# Patient Record
Sex: Female | Born: 1995
Health system: Southern US, Community
[De-identification: ages and names within clinical notes are randomized; demographics above are authoritative.]

## PROBLEM LIST (undated history)

## (undated) ENCOUNTER — Inpatient Hospital Stay (HOSPITAL_COMMUNITY): Payer: Self-pay

## (undated) DIAGNOSIS — E282 Polycystic ovarian syndrome: Secondary | ICD-10-CM

## (undated) DIAGNOSIS — K219 Gastro-esophageal reflux disease without esophagitis: Secondary | ICD-10-CM

## (undated) DIAGNOSIS — F419 Anxiety disorder, unspecified: Secondary | ICD-10-CM

## (undated) DIAGNOSIS — R87629 Unspecified abnormal cytological findings in specimens from vagina: Secondary | ICD-10-CM

## (undated) DIAGNOSIS — G90A Postural orthostatic tachycardia syndrome (POTS): Secondary | ICD-10-CM

## (undated) DIAGNOSIS — J189 Pneumonia, unspecified organism: Secondary | ICD-10-CM

## (undated) DIAGNOSIS — R519 Headache, unspecified: Secondary | ICD-10-CM

## (undated) DIAGNOSIS — G43909 Migraine, unspecified, not intractable, without status migrainosus: Secondary | ICD-10-CM

## (undated) DIAGNOSIS — R000 Tachycardia, unspecified: Secondary | ICD-10-CM

## (undated) HISTORY — DX: Tachycardia, unspecified: R00.0

## (undated) HISTORY — PX: LASIK: SHX215

## (undated) HISTORY — DX: Unspecified abnormal cytological findings in specimens from vagina: R87.629

## (undated) HISTORY — PX: ADENOIDECTOMY: SUR15

## (undated) HISTORY — DX: Headache, unspecified: R51.9

## (undated) HISTORY — DX: Migraine, unspecified, not intractable, without status migrainosus: G43.909

## (undated) HISTORY — PX: TYMPANOSTOMY TUBE PLACEMENT: SHX32

## (undated) HISTORY — PX: IR ANGIOGRAM FOLLOW UP STUDY: IMG697

---
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
## 2014-07-05 ENCOUNTER — Encounter: Payer: Self-pay | Admitting: Advanced Practice Midwife

## 2014-10-07 ENCOUNTER — Emergency Department (HOSPITAL_COMMUNITY)
Admission: EM | Admit: 2014-10-07 | Discharge: 2014-10-07 | Disposition: A | Discharge status: Home / Self Care | Payer: 59 | Attending: Emergency Medicine | Admitting: Emergency Medicine

## 2014-10-07 ENCOUNTER — Emergency Department (HOSPITAL_COMMUNITY)
Admission: EM | Admit: 2014-10-07 | Discharge: 2014-10-07 | Disposition: A | Discharge status: Home / Self Care | Payer: 59 | Source: Home / Self Care | Attending: Family Medicine | Admitting: Family Medicine

## 2014-10-07 ENCOUNTER — Encounter (HOSPITAL_COMMUNITY): Payer: Self-pay | Admitting: *Deleted

## 2014-10-07 ENCOUNTER — Encounter (HOSPITAL_COMMUNITY): Payer: Self-pay | Admitting: Emergency Medicine

## 2014-10-07 DIAGNOSIS — R000 Tachycardia, unspecified: Secondary | ICD-10-CM | POA: Insufficient documentation

## 2014-10-07 DIAGNOSIS — Z3202 Encounter for pregnancy test, result negative: Secondary | ICD-10-CM | POA: Insufficient documentation

## 2014-10-07 DIAGNOSIS — J45909 Unspecified asthma, uncomplicated: Secondary | ICD-10-CM | POA: Diagnosis not present

## 2014-10-07 DIAGNOSIS — J029 Acute pharyngitis, unspecified: Secondary | ICD-10-CM | POA: Diagnosis present

## 2014-10-07 DIAGNOSIS — R109 Unspecified abdominal pain: Secondary | ICD-10-CM | POA: Insufficient documentation

## 2014-10-07 DIAGNOSIS — R509 Fever, unspecified: Secondary | ICD-10-CM

## 2014-10-07 DIAGNOSIS — R1010 Upper abdominal pain, unspecified: Secondary | ICD-10-CM

## 2014-10-07 LAB — URINALYSIS, ROUTINE W REFLEX MICROSCOPIC
GLUCOSE, UA: NEGATIVE mg/dL
Ketones, ur: 40 mg/dL — AB
NITRITE: NEGATIVE
PH: 5.5 (ref 5.0–8.0)
Protein, ur: NEGATIVE mg/dL
SPECIFIC GRAVITY, URINE: 1.019 (ref 1.005–1.030)
Urobilinogen, UA: 1 mg/dL (ref 0.0–1.0)

## 2014-10-07 LAB — CBC WITH DIFFERENTIAL/PLATELET
BASOS ABS: 0 10*3/uL (ref 0.0–0.1)
Basophils Relative: 0 % (ref 0–1)
EOS PCT: 0 % (ref 0–5)
Eosinophils Absolute: 0 10*3/uL (ref 0.0–0.7)
HCT: 40.2 % (ref 36.0–46.0)
Hemoglobin: 13.5 g/dL (ref 12.0–15.0)
LYMPHS ABS: 0.9 10*3/uL (ref 0.7–4.0)
LYMPHS PCT: 12 % (ref 12–46)
MCH: 29.9 pg (ref 26.0–34.0)
MCHC: 33.6 g/dL (ref 30.0–36.0)
MCV: 89.1 fL (ref 78.0–100.0)
Monocytes Absolute: 1 10*3/uL (ref 0.1–1.0)
Monocytes Relative: 13 % — ABNORMAL HIGH (ref 3–12)
NEUTROS ABS: 5.7 10*3/uL (ref 1.7–7.7)
NEUTROS PCT: 75 % (ref 43–77)
PLATELETS: 193 10*3/uL (ref 150–400)
RBC: 4.51 MIL/uL (ref 3.87–5.11)
RDW: 11.9 % (ref 11.5–15.5)
WBC: 7.5 10*3/uL (ref 4.0–10.5)

## 2014-10-07 LAB — COMPREHENSIVE METABOLIC PANEL
ALK PHOS: 83 U/L (ref 39–117)
ALT: 16 U/L (ref 0–35)
AST: 18 U/L (ref 0–37)
Albumin: 3.7 g/dL (ref 3.5–5.2)
Anion gap: 15 (ref 5–15)
BUN: 9 mg/dL (ref 6–23)
CALCIUM: 9 mg/dL (ref 8.4–10.5)
CO2: 23 meq/L (ref 19–32)
Chloride: 99 mEq/L (ref 96–112)
Creatinine, Ser: 0.89 mg/dL (ref 0.50–1.10)
GLUCOSE: 100 mg/dL — AB (ref 70–99)
POTASSIUM: 3.6 meq/L — AB (ref 3.7–5.3)
SODIUM: 137 meq/L (ref 137–147)
Total Bilirubin: 0.4 mg/dL (ref 0.3–1.2)
Total Protein: 7.3 g/dL (ref 6.0–8.3)

## 2014-10-07 LAB — PREGNANCY, URINE: PREG TEST UR: NEGATIVE

## 2014-10-07 LAB — URINE MICROSCOPIC-ADD ON

## 2014-10-07 LAB — POCT RAPID STREP A: STREPTOCOCCUS, GROUP A SCREEN (DIRECT): NEGATIVE

## 2014-10-07 LAB — LIPASE, BLOOD: Lipase: 26 U/L (ref 11–59)

## 2014-10-07 MED ORDER — ACETAMINOPHEN 325 MG PO TABS
ORAL_TABLET | ORAL | Status: AC
  Filled 2014-10-07: qty 3

## 2014-10-07 NOTE — Discharge Instructions (Signed)
Abdominal (belly) pain can be caused by many things. Your caregiver performed an examination and possibly ordered blood/urine tests and imaging (CT scan, x-rays, ultrasound). Many cases can be observed and treated at home after initial evaluation in the emergency department. Even though you are being discharged home, abdominal pain can be unpredictable. Therefore, you need a repeated exam if your pain does not resolve, returns, or worsens. Most patients with abdominal pain don't have to be admitted to the hospital or have surgery, but serious problems like appendicitis and gallbladder attacks can start out as nonspecific pain. Many abdominal conditions cannot be diagnosed in one visit, so follow-up evaluations are very important. SEEK IMMEDIATE MEDICAL ATTENTION IF: The pain does not go away or becomes severe.  A temperature above 101 develops.  Repeated vomiting occurs (multiple episodes).  The pain becomes localized to portions of the abdomen. The right side could possibly be appendicitis. In an adult, the left lower portion of the abdomen could be colitis or diverticulitis.  Blood is being passed in stools or vomit (bright red or black tarry stools).  Return also if you develop chest pain, difficulty breathing, dizziness or fainting, or become confused, poorly responsive, or inconsolable (young children).  A fever means your temperature is over 100.4 F (37.8C). In adults this is most often due to viral or bacterial infections. If a fever lasts for over 2 or 3 days and does not get better with medicine, further medical examination is needed to find the cause. SEEK MEDICAL CARE IF: Your temperature stays around 104 F (40 C) or if you have repeated vomiting, unable to take fluids, breathing problems, a severe headache, confusion, a new rash, abdominal pain, difficulty urinating, become poorly responsive, or any new symptoms.

## 2014-10-07 NOTE — ED Notes (Signed)
C/o  Severe sore throat and fever since Friday.  No otc meds taken for symptoms.   Denies vomiting and diarrhea.

## 2014-10-07 NOTE — ED Notes (Signed)
The pt has had a sorethroat abd pain elevated temp since Friday.  She was seen at [facility] and a neg strep screen was done.  She had tylenol there  And she was sent down for treatment  lmp last week

## 2014-10-07 NOTE — ED Notes (Signed)
Pt given 975 mg of tylenol for fever.  Mw,cma 

## 2014-10-07 NOTE — ED Provider Notes (Signed)
Kari Reilly is a 18 y.o. female who presents to Urgent Care today for sore throat fevers and body aches. Symptoms present for 3 days. No vomiting or diarrhea. No abdominal pain. Patient notes mild chest pain. She feels well otherwise.   Past Medical History  Diagnosis Date  . Asthma    Past Surgical History  Procedure Laterality Date  . Tympanostomy tube placement    . Adenoidectomy     History  Substance Use Topics  . Smoking status: Never Smoker   . Smokeless tobacco: Not on file  . Alcohol Use: No   ROS as above Medications: No current facility-administered medications for this encounter.   Current Outpatient Prescriptions  Medication Sig Dispense Refill  . cyclobenzaprine (FLEXERIL) 10 MG tablet 1 po bid for spasm. 14 tablet 0  . norethindrone-ethinyl estradiol (JUNEL FE,GILDESS FE,LOESTRIN FE) 1-20 MG-MCG tablet Take 1 tablet by mouth daily.     Allergies  Allergen Reactions  . Clarithromycin   . Codeine      Exam:  BP 125/89 mmHg  Pulse 148  Temp(Src) 103 F (39.4 C) (Oral)  Resp 16  SpO2 96%  LMP 09/29/2014 Gen: Well NAD HEENT: EOMI,  MMM posterior pharynx erythematous with exudate. Normal tympanic membranes bilaterally Lungs: Normal work of breathing. CTABL Heart: Tachycardia present no MRG Abd: NABS, Soft. Nondistended, tender palpation upper left quadrant. No mass palpated. Exts: Brisk capillary refill, warm and well perfused.   Results for orders placed or performed during the hospital encounter of 10/07/14 (from the past 24 hour(s))  POCT rapid strep A [facility])     Status: None   Collection Time: 10/07/14  4:32 PM  Result Value Ref Range   Streptococcus, Group A Screen (Direct) NEGATIVE NEGATIVE   No results found.  Assessment and Plan: 18 y.o. female with fever. Patient additionally has significant tachycardia and is tender to palpation in her upper left quadrant. Transfer to the emergency department via private vehicle for evaluation  and management. Strep negative culture pending.  Discussed warning signs or symptoms. Please see discharge instructions. Patient expresses understanding.     Rodolph BongEvan S Derryl Uher, MD 10/07/14 [phone]

## 2014-10-07 NOTE — ED Provider Notes (Signed)
CSN: [id number]     Arrival date & time 10/07/14  1700 History   First MD Initiated Contact with Patient 10/07/14 1751     Chief Complaint  Patient presents with  . multiple complaints      (Consider location/radiation/quality/duration/timing/severity/associated sxs/prior Treatment) HPI 18 year old female 3 days of fever with sore throat with some generalized body aches patient did not notice abdominal pain at all until she was at urgent care just prior to arrival to the emergency department and when the urgent care doctor palpated the patient's upper abdomen the patient had some mild tenderness across her upper abdomen that she had not noticed earlier today, there is been no vomiting no diarrhea no rash no confusion no headache no stiff neck no cough no runny nose no nasal congestion no dysuria no other concerns. Patient had negative rapid strep test at urgent care. Patient has normal voice with no stridor no trismus no drooling. Past Medical History  Diagnosis Date  . Asthma    Past Surgical History  Procedure Laterality Date  . Tympanostomy tube placement    . Adenoidectomy     No family history on file. History  Substance Use Topics  . Smoking status: Never Smoker   . Smokeless tobacco: Not on file  . Alcohol Use: No   OB History    No data available     Review of Systems 10 Systems reviewed and are negative for acute change except as noted in the HPI.   Allergies  Codeine and Clarithromycin  Home Medications   Prior to Admission medications   Medication Sig Start Date End Date Taking? Authorizing Provider  etonogestrel-ethinyl estradiol (NUVARING) 0.12-0.015 MG/24HR vaginal ring Place 1 each vaginally every 28 (twenty-eight) days. Insert vaginally and leave in place for 3 consecutive weeks, then remove for 1 week.   Yes Historical Provider, MD  cyclobenzaprine (FLEXERIL) 10 MG tablet 1 po bid for spasm. Patient not taking: Reported on 10/07/2014 01/08/12   Kathie DikeHobson M  Bryant, PA-C  HYDROcodone-acetaminophen (HYCET) 7.5-325 mg/15 ml solution Take 15 mLs by mouth every 8 (eight) hours as needed for moderate pain. 10/08/14   Mora BellmanHannah S Merrell, PA-C  valACYclovir (VALTREX) 1000 MG tablet Take 1 tablet (1,000 mg total) by mouth 3 (three) times daily. 10/10/14   Jacklyn ShellFrances Cresenzo-Dishmon, CNM  zolpidem (AMBIEN) 5 MG tablet Take 5 mg by mouth at bedtime. 09/20/14   Historical Provider, MD   BP 122/75 mmHg  Pulse 89  Temp(Src) 101.2 F (38.4 C) (Oral)  Resp 19  SpO2 99%  LMP 09/29/2014 Physical Exam  Constitutional:  Awake, alert, nontoxic appearance.  HENT:  Head: Atraumatic.  Mouth/Throat: Oropharyngeal exudate present.  Midline uvula mildly enlarged tonsils with minimal erythema with scant exudate present no trismus no drooling no stridor doubt peritonsillar abscess  Eyes: Conjunctivae are normal. Right eye exhibits no discharge. Left eye exhibits no discharge.  Neck: Neck supple.  Cardiovascular: Regular rhythm.   No murmur heard. Tachycardic  Pulmonary/Chest: Effort normal and breath sounds normal. No respiratory distress. She has no wheezes. She has no rales. She exhibits no tenderness.  Pulse oximetry normal room air 96%  Abdominal: Soft. Bowel sounds are normal. She exhibits no distension and no mass. There is tenderness. There is no rebound and no guarding.  Minimal diffuse upper abdominal tenderness no tenderness to the lower abdomen  Musculoskeletal: She exhibits no edema or tenderness.  Baseline ROM, no obvious new focal weakness.  Neurological: She is alert.  Mental status and  motor strength appears baseline for patient and situation.  Skin: No rash noted.  Psychiatric: She has a normal mood and affect.  Nursing note and vitals reviewed.   ED Course  Procedures (including critical care time) Doubt bacterial sepsis; suspect contaminated urine specimen. Patient prefers oral hydration at home rather than IV fluids in the emergency department.   Labs Review Labs Reviewed  CBC WITH DIFFERENTIAL - Abnormal; Notable for the following:    Monocytes Relative 13 (*)    All other components within normal limits  COMPREHENSIVE METABOLIC PANEL - Abnormal; Notable for the following:    Potassium 3.6 (*)    Glucose, Bld 100 (*)    All other components within normal limits  URINALYSIS, ROUTINE W REFLEX MICROSCOPIC - Abnormal; Notable for the following:    APPearance CLOUDY (*)    Hgb urine dipstick TRACE (*)    Bilirubin Urine SMALL (*)    Ketones, ur 40 (*)    Leukocytes, UA MODERATE (*)    All other components within normal limits  URINE MICROSCOPIC-ADD ON - Abnormal; Notable for the following:    Squamous Epithelial / LPF MANY (*)    Bacteria, UA MANY (*)    All other components within normal limits  CULTURE, GROUP A STREP  LIPASE, BLOOD  PREGNANCY, URINE    Imaging Review No results found.   EKG Interpretation None      MDM   Final diagnoses:  Viral pharyngitis  Pain of upper abdomen    Patient / Family / Caregiver informed of clinical course, understand medical decision-making process, and agree with plan. I doubt any other EMC precluding discharge at this time including, but not necessarily limited to the following:PTA.    Hurman HornJohn M Demarie Hyneman, MD 10/11/14 [phone]

## 2014-10-08 ENCOUNTER — Emergency Department (HOSPITAL_COMMUNITY)
Admission: EM | Admit: 2014-10-08 | Discharge: 2014-10-08 | Disposition: A | Discharge status: Home / Self Care | Payer: 59 | Attending: Emergency Medicine | Admitting: Emergency Medicine

## 2014-10-08 ENCOUNTER — Encounter (HOSPITAL_COMMUNITY): Payer: Self-pay | Admitting: Family Medicine

## 2014-10-08 DIAGNOSIS — J45909 Unspecified asthma, uncomplicated: Secondary | ICD-10-CM | POA: Diagnosis not present

## 2014-10-08 DIAGNOSIS — Z79899 Other long term (current) drug therapy: Secondary | ICD-10-CM | POA: Diagnosis not present

## 2014-10-08 DIAGNOSIS — J029 Acute pharyngitis, unspecified: Secondary | ICD-10-CM | POA: Diagnosis not present

## 2014-10-08 DIAGNOSIS — R000 Tachycardia, unspecified: Secondary | ICD-10-CM | POA: Diagnosis not present

## 2014-10-08 LAB — CBC WITH DIFFERENTIAL/PLATELET
BASOS ABS: 0 10*3/uL (ref 0.0–0.1)
BASOS PCT: 0 % (ref 0–1)
Eosinophils Absolute: 0 10*3/uL (ref 0.0–0.7)
Eosinophils Relative: 0 % (ref 0–5)
HEMATOCRIT: 37.6 % (ref 36.0–46.0)
HEMOGLOBIN: 12.8 g/dL (ref 12.0–15.0)
LYMPHS PCT: 18 % (ref 12–46)
Lymphs Abs: 1.2 10*3/uL (ref 0.7–4.0)
MCH: 29.7 pg (ref 26.0–34.0)
MCHC: 34 g/dL (ref 30.0–36.0)
MCV: 87.2 fL (ref 78.0–100.0)
Monocytes Absolute: 0.7 10*3/uL (ref 0.1–1.0)
Monocytes Relative: 10 % (ref 3–12)
NEUTROS ABS: 5 10*3/uL (ref 1.7–7.7)
NEUTROS PCT: 72 % (ref 43–77)
PLATELETS: 148 10*3/uL — AB (ref 150–400)
RBC: 4.31 MIL/uL (ref 3.87–5.11)
RDW: 11.9 % (ref 11.5–15.5)
WBC: 6.9 10*3/uL (ref 4.0–10.5)

## 2014-10-08 LAB — BASIC METABOLIC PANEL
ANION GAP: 15 (ref 5–15)
BUN: 8 mg/dL (ref 6–23)
CHLORIDE: 98 meq/L (ref 96–112)
CO2: 24 meq/L (ref 19–32)
Calcium: 8.9 mg/dL (ref 8.4–10.5)
Creatinine, Ser: 0.81 mg/dL (ref 0.50–1.10)
GFR calc non Af Amer: >90 mL/min (ref >90)
Glucose, Bld: 79 mg/dL (ref 70–99)
Potassium: 3.8 mEq/L (ref 3.7–5.3)
SODIUM: 137 meq/L (ref 137–147)

## 2014-10-08 LAB — I-STAT CG4 LACTIC ACID, ED: LACTIC ACID, VENOUS: 0.65 mmol/L (ref 0.5–2.2)

## 2014-10-08 MED ORDER — SODIUM CHLORIDE 0.9 % IV BOLUS (SEPSIS)
1000.0000 mL | Freq: Once | INTRAVENOUS | Status: AC
  Administered 2014-10-08: 1000 mL via INTRAVENOUS

## 2014-10-08 MED ORDER — LORAZEPAM 2 MG/ML IJ SOLN
0.5000 mg | Freq: Once | INTRAMUSCULAR | Status: AC
  Administered 2014-10-08: 0.5 mg via INTRAVENOUS
  Filled 2014-10-08: qty 1

## 2014-10-08 MED ORDER — IBUPROFEN 800 MG PO TABS
800.0000 mg | ORAL_TABLET | Freq: Once | ORAL | Status: AC
  Administered 2014-10-08: 800 mg via ORAL
  Filled 2014-10-08: qty 1

## 2014-10-08 MED ORDER — HYDROCODONE-ACETAMINOPHEN 7.5-325 MG/15ML PO SOLN: 15.0000 mL | Freq: Three times a day (TID) | ORAL | Status: DC | PRN

## 2014-10-08 MED ORDER — MORPHINE SULFATE 4 MG/ML IJ SOLN
2.0000 mg | Freq: Once | INTRAMUSCULAR | Status: AC
  Administered 2014-10-08: 2 mg via INTRAVENOUS
  Filled 2014-10-08: qty 1

## 2014-10-08 MED ORDER — DEXAMETHASONE SODIUM PHOSPHATE 10 MG/ML IJ SOLN
10.0000 mg | Freq: Once | INTRAMUSCULAR | Status: AC
  Administered 2014-10-08: 10 mg via INTRAVENOUS
  Filled 2014-10-08: qty 1

## 2014-10-08 MED ORDER — MORPHINE SULFATE 4 MG/ML IJ SOLN
4.0000 mg | Freq: Once | INTRAMUSCULAR | Status: AC
  Administered 2014-10-08: 4 mg via INTRAVENOUS
  Filled 2014-10-08: qty 1

## 2014-10-08 MED ORDER — ONDANSETRON HCL 4 MG/2ML IJ SOLN
4.0000 mg | Freq: Once | INTRAMUSCULAR | Status: AC
  Administered 2014-10-08: 4 mg via INTRAVENOUS
  Filled 2014-10-08: qty 2

## 2014-10-08 MED ORDER — ACETAMINOPHEN 500 MG PO TABS
1000.0000 mg | ORAL_TABLET | Freq: Once | ORAL | Status: AC
  Administered 2014-10-08: 1000 mg via ORAL
  Filled 2014-10-08: qty 2

## 2014-10-08 NOTE — ED Provider Notes (Signed)
  Face-to-face evaluation   History: Ill for several days with sore throat.  She has had decreased appetite because it hurts to eat.  Rapid strep screen negative, and a throat culture is pending  Physical exam: Alert, cooperative.  No trismus.  Bilateral tonsillar hypertrophy, mild, with exudate.  Neck with diffuse anterior cervical adenopathy.  No meningismus  Medical screening examination/treatment/procedure(s) were conducted as a shared visit with non-physician practitioner(s) and myself.  I personally evaluated the patient during the encounter  Flint MelterElliott L Edon Hoadley, MD 10/11/14 [phone]

## 2014-10-08 NOTE — ED Notes (Signed)
Per pt sts she was here yesterday and sore throat and swelling worse. sts unable to swallow meds.

## 2014-10-08 NOTE — ED Provider Notes (Signed)
CSN: [id number]     Arrival date & time 10/08/14  1317 History   First MD Initiated Contact with Patient 10/08/14 1417     Chief Complaint  Patient presents with  . Sore Throat     (Consider location/radiation/quality/duration/timing/severity/associated sxs/prior Treatment) HPI Comments: Patient's a 18 year old female with history of asthma, adenoidectomy who presents the emergency department today for evaluation of sore throat. She reports that she has been seen at urgent care in the emergency department yesterday. Her strep test was negative and she was told she had viral pharyngitis. She has continued to have worsening sore throat and fever. Her fever has been as high as 103F. She now feels as though her throat hurts too badly to swallow her pills. He has been taking antipyretics every 4 hours with her last dose being approximately 3 hours ago. She has associated body aches.  The history is provided by the patient. No language interpreter was used.    Past Medical History  Diagnosis Date  . Asthma    Past Surgical History  Procedure Laterality Date  . Tympanostomy tube placement    . Adenoidectomy     History reviewed. No pertinent family history. History  Substance Use Topics  . Smoking status: Never Smoker   . Smokeless tobacco: Not on file  . Alcohol Use: No   OB History    No data available     Review of Systems  Constitutional: Positive for fever and chills.  HENT: Positive for sore throat.   Respiratory: Negative for shortness of breath.   Cardiovascular: Negative for chest pain.  All other systems reviewed and are negative.     Allergies  Codeine and Clarithromycin  Home Medications   Prior to Admission medications   Medication Sig Start Date End Date Taking? Authorizing Provider  etonogestrel-ethinyl estradiol (NUVARING) 0.12-0.015 MG/24HR vaginal ring Place 1 each vaginally every 28 (twenty-eight) days. Insert vaginally and leave in place for 3  consecutive weeks, then remove for 1 week.   Yes Historical Provider, MD  zolpidem (AMBIEN) 5 MG tablet Take 5 mg by mouth at bedtime. 09/20/14  Yes Historical Provider, MD  cyclobenzaprine (FLEXERIL) 10 MG tablet 1 po bid for spasm. Patient not taking: Reported on 10/07/2014 01/08/12   Kathie DikeHobson M Bryant, PA-C   BP 110/65 mmHg  Pulse 105  Temp(Src) 102.9 F (39.4 C) (Oral)  Resp 23  SpO2 98%  LMP 09/29/2014 Physical Exam  Constitutional: She is oriented to person, place, and time. She appears well-developed and well-nourished. No distress.  HENT:  Head: Normocephalic and atraumatic.  Right Ear: Tympanic membrane, external ear and ear canal normal.  Left Ear: Tympanic membrane, external ear and ear canal normal.  Nose: Nose normal. Right sinus exhibits no maxillary sinus tenderness and no frontal sinus tenderness. Left sinus exhibits no maxillary sinus tenderness and no frontal sinus tenderness.  Mouth/Throat: Uvula is midline. No trismus in the jaw. Oropharyngeal exudate and posterior oropharyngeal erythema present. No tonsillar abscesses.  Bilateral tonsillar enlargement with exudate  Eyes: Conjunctivae are normal.  Neck: Normal range of motion.  Cardiovascular: Regular rhythm and normal heart sounds.  Tachycardia present.   Pulmonary/Chest: Effort normal and breath sounds normal. No stridor. No respiratory distress. She has no wheezes. She has no rales.  Abdominal: Soft. She exhibits no distension. There is no tenderness.  Musculoskeletal: Normal range of motion.  Neurological: She is alert and oriented to person, place, and time. She has normal strength.  Skin: Skin is warm  and dry. She is not diaphoretic. No erythema.  Psychiatric: She has a normal mood and affect. Her behavior is normal.  Nursing note and vitals reviewed.   ED Course  Procedures (including critical care time) Labs Review Labs Reviewed  CBC WITH DIFFERENTIAL - Abnormal; Notable for the following:    Platelets  148 (*)    All other components within normal limits  BASIC METABOLIC PANEL  I-STAT CG4 LACTIC ACID, ED    Imaging Review No results found.   EKG Interpretation None      MDM   Final diagnoses:  Pharyngitis    Patient presents to ED for evaluation of sore throat. Initially negative strep test, culture is pending. Patient does have bilateral tonsillar enlargement with exudate. She feels significantly improved after IV Decadron, fluids, morphine. No trismus, tongue elevation, submental edema. Discussed reasons to return to ED immediately. Vital signs stable for discharge. Dr. Effie ShyWentz evaluated patient and agrees with plan. Patient / Family / Caregiver informed of clinical course, understand medical decision-making process, and agree with plan.     Mora BellmanHannah S Moncia Annas, PA-C 10/10/14 1514  Flint MelterElliott L Wentz, MD 10/11/14 [phone]

## 2014-10-08 NOTE — Discharge Instructions (Signed)

## 2014-10-08 NOTE — ED Notes (Signed)
NAD at this time,

## 2014-10-09 LAB — CULTURE, GROUP A STREP

## 2014-10-10 ENCOUNTER — Other Ambulatory Visit: Payer: Self-pay | Admitting: Advanced Practice Midwife

## 2014-10-10 MED ORDER — VALACYCLOVIR HCL 1 G PO TABS: 1000.0000 mg | ORAL_TABLET | Freq: Three times a day (TID) | ORAL | Status: DC

## 2014-10-10 NOTE — Progress Notes (Signed)
Pt sent a photo of her throat (has been to ED twice in last few days with fever, body aches, severely sore throat) neg strep, already has had mono.  Photo clearly demonstrates coxsackie virus.  Valtrex 1gm TID X 5 sent to pharmacy.

## 2014-10-24 ENCOUNTER — Other Ambulatory Visit: Payer: Self-pay | Admitting: Advanced Practice Midwife

## 2014-10-24 MED ORDER — VALACYCLOVIR HCL 1 G PO TABS: 1000.0000 mg | ORAL_TABLET | Freq: Three times a day (TID) | ORAL | Status: AC

## 2014-10-24 NOTE — Progress Notes (Signed)
Recurrance of white plaques/pustules on throat.  Throat sore, no fever, HA, neck stiffness. Most likely persistant coxsakie.  Rx Valtrex 1g TID X 30 days

## 2016-02-05 ENCOUNTER — Encounter (HOSPITAL_COMMUNITY): Payer: Self-pay | Admitting: Emergency Medicine

## 2016-02-05 ENCOUNTER — Emergency Department (HOSPITAL_COMMUNITY)
Admission: EM | Admit: 2016-02-05 | Discharge: 2016-02-06 | Disposition: A | Discharge status: Home / Self Care | Payer: Self-pay | Attending: Emergency Medicine | Admitting: Emergency Medicine

## 2016-02-05 ENCOUNTER — Emergency Department (HOSPITAL_COMMUNITY): Payer: Self-pay

## 2016-02-05 DIAGNOSIS — J45909 Unspecified asthma, uncomplicated: Secondary | ICD-10-CM | POA: Insufficient documentation

## 2016-02-05 DIAGNOSIS — M5116 Intervertebral disc disorders with radiculopathy, lumbar region: Secondary | ICD-10-CM | POA: Insufficient documentation

## 2016-02-05 DIAGNOSIS — M545 Low back pain: Secondary | ICD-10-CM | POA: Diagnosis not present

## 2016-02-05 MED ORDER — DEXAMETHASONE 4 MG PO TABS: 4.0000 mg | ORAL_TABLET | Freq: Two times a day (BID) | ORAL | Status: DC

## 2016-02-05 MED ORDER — CYCLOBENZAPRINE HCL 10 MG PO TABS
10.0000 mg | ORAL_TABLET | Freq: Three times a day (TID) | ORAL | Status: DC
Start: 2016-02-05 — End: 2022-06-12

## 2016-02-05 MED ORDER — OXYCODONE-ACETAMINOPHEN 5-325 MG PO TABS: 1.0000 | ORAL_TABLET | Freq: Four times a day (QID) | ORAL | Status: DC | PRN

## 2016-02-05 MED ORDER — HYDROCODONE-ACETAMINOPHEN 5-325 MG PO TABS
1.0000 | ORAL_TABLET | Freq: Once | ORAL | Status: AC
Start: 2016-02-05 — End: 2016-02-05
  Administered 2016-02-05: 1 via ORAL
  Filled 2016-02-05: qty 1

## 2016-02-05 MED ORDER — IBUPROFEN 800 MG PO TABS
800.0000 mg | ORAL_TABLET | Freq: Once | ORAL | Status: AC
  Administered 2016-02-05: 800 mg via ORAL
  Filled 2016-02-05: qty 1

## 2016-02-05 MED ORDER — MELOXICAM 15 MG PO TABS: 15.0000 mg | ORAL_TABLET | Freq: Every day | ORAL | Status: DC

## 2016-02-05 MED ORDER — HYDROMORPHONE HCL 1 MG/ML IJ SOLN
1.0000 mg | Freq: Once | INTRAMUSCULAR | Status: AC
  Administered 2016-02-05: 1 mg via INTRAMUSCULAR
  Filled 2016-02-05: qty 1

## 2016-02-05 MED ORDER — DIAZEPAM 5 MG PO TABS
10.0000 mg | ORAL_TABLET | Freq: Once | ORAL | Status: AC
  Administered 2016-02-05: 10 mg via ORAL
  Filled 2016-02-05: qty 2

## 2016-02-05 MED ORDER — PROMETHAZINE HCL 12.5 MG PO TABS
12.5000 mg | ORAL_TABLET | Freq: Once | ORAL | Status: AC
  Administered 2016-02-05: 12.5 mg via ORAL
  Filled 2016-02-05: qty 1

## 2016-02-05 NOTE — ED Notes (Signed)
Pregnancy Urine POC : NEGATIVE

## 2016-02-05 NOTE — ED Notes (Signed)
Pt c/o lower back pain that radiates down right leg.  

## 2016-02-05 NOTE — Discharge Instructions (Signed)
Your CT scan reveals a central disc protrusion at the lumbar 5,  sacral one area that is a close to your sacral one nerve root. Please use Flexeril, meloxicam, and Decadron daily with food until seen by the orthopedic specialist. Use Percocet for more severe pain. Percocet may cause drowsiness, and/or constipation. You may want to use a stool softener when taking Percocet. Heat may be helpful with your pain. Please call Dr. August Saucerean tomorrow morning for an appointment as soon as possible concerning your back. Please rest your back is much as possible. Lumbosacral Radiculopathy Lumbosacral radiculopathy is a condition that involves the spinal nerves and nerve roots in the low back and bottom of the spine. The condition develops when these nerves and nerve roots move out of place or become inflamed and cause symptoms. CAUSES This condition may be caused by:  Pressure from a disk that bulges out of place (herniated disk). A disk is a plate of cartilage that separates bones in the spine.  Disk degeneration.  A narrowing of the bones of the lower back (spinal stenosis).  A tumor.  An infection.  An injury that places sudden pressure on the disks that cushion the bones of your lower spine. RISK FACTORS This condition is more likely to develop in:  Males aged 30-50 years.  Females aged 50-60 years.  People who lift improperly.  People who are overweight or live a sedentary lifestyle.  People who smoke.  People who perform repetitive activities that strain the spine. SYMPTOMS Symptoms of this condition include:  Pain that goes down from the back into the legs (sciatica). This is the most common symptom. The pain may be worse with sitting, coughing, or sneezing.  Pain and numbness in the arms and legs.  Muscle weakness.  Tingling.  Loss of bladder control or bowel control. DIAGNOSIS This condition is diagnosed with a physical exam and medical history. If the pain is lasting, you may  have tests, such as:  MRI scan.  X-ray.  CT scan.  Myelogram.  Nerve conduction study. TREATMENT This condition is often treated with:  Hot packs and ice applied to affected areas.  Stretches to improve flexibility.  Exercises to strengthen back muscles.  Physical therapy.  Pain medicine.  A steroid injection in the spine. In some cases, no treatment is needed. If the condition is long-lasting (chronic), or if symptoms are severe, treatment may involve surgery or lifestyle changes, such as following a weight loss plan. HOME CARE INSTRUCTIONS Medicines  Take medicines only as directed by your health care provider.  Do not drive or operate heavy machinery while taking pain medicine. Injury Care  Apply a heat pack to the injured area as directed by your health care provider.  Apply ice to the affected area:  Put ice in a plastic bag.  Place a towel between your skin and the bag.  Leave the ice on for 20-30 minutes, every 2 hours while you are awake or as needed. Or, leave the ice on for as long as directed by your health care provider. Other Instructions  If you were shown how to do any exercises or stretches, do them as directed by your health care provider.  If your health care provider prescribed a diet or exercise program, follow it as directed.  Keep all follow-up visits as directed by your health care provider. This is important. SEEK MEDICAL CARE IF:  Your pain does not improve over time even when taking pain medicines. SEEK IMMEDIATE MEDICAL  CARE IF:  Your develop severe pain.  Your pain suddenly gets worse.  You develop increasing weakness in your legs.  You lose the ability to control your bladder or bowel.  You have difficulty walking or balancing.  You have a fever.   This information is not intended to replace advice given to you by your health care provider. Make sure you discuss any questions you have with your health care provider.     Document Released: 11/02/2005 Document Revised: 03/19/2015 Document Reviewed: 10/29/2014 Elsevier Interactive Patient Education [employer]

## 2016-02-05 NOTE — ED Provider Notes (Signed)
CSN: [id number]     Arrival date & time 02/05/16  1917 History   First MD Initiated Contact with Patient 02/05/16 2025     Chief Complaint  Patient presents with  . Back Pain     (Consider location/radiation/quality/duration/timing/severity/associated sxs/prior Treatment) Patient is a 20 y.o. female presenting with back pain. The history is provided by the patient.  Back Pain Location:  Lumbar spine Quality:  Aching and shooting Pain severity:  Severe Pain is:  Same all the time Onset quality:  Gradual Duration:  2 weeks Timing:  Intermittent Progression:  Worsening Chronicity:  New Context: not falling, not lifting heavy objects, not recent illness, not recent injury and not twisting   Relieved by:  Nothing Worsened by:  Ambulation, bending, movement and palpation Ineffective treatments:  None tried Associated symptoms: no abdominal pain, no bladder incontinence, no bowel incontinence and no perianal numbness     Past Medical History  Diagnosis Date  . Asthma    Past Surgical History  Procedure Laterality Date  . Tympanostomy tube placement    . Adenoidectomy     History reviewed. No pertinent family history. Social History  Substance Use Topics  . Smoking status: Never Smoker   . Smokeless tobacco: None  . Alcohol Use: No   OB History    No data available     Review of Systems  Gastrointestinal: Negative for abdominal pain and bowel incontinence.  Genitourinary: Negative for bladder incontinence.  Musculoskeletal: Positive for back pain.  Psychiatric/Behavioral: The patient is nervous/anxious.   All other systems reviewed and are negative.     Allergies  Codeine and Clarithromycin  Home Medications   Prior to Admission medications   Medication Sig Start Date End Date Taking? Authorizing Provider  cyclobenzaprine (FLEXERIL) 10 MG tablet 1 po bid for spasm. Patient not taking: Reported on 10/07/2014 01/08/12   Ivery Quale, PA-C  etonogestrel-ethinyl  estradiol (NUVARING) 0.12-0.015 MG/24HR vaginal ring Place 1 each vaginally every 28 (twenty-eight) days. Insert vaginally and leave in place for 3 consecutive weeks, then remove for 1 week.    Historical Provider, MD  HYDROcodone-acetaminophen (HYCET) 7.5-325 mg/15 ml solution Take 15 mLs by mouth every 8 (eight) hours as needed for moderate pain. Patient not taking: Reported on 02/05/2016 10/08/14   Junious Silk, PA-C   BP 125/77 mmHg  Pulse 92  Temp(Src) 98.6 F (37 C) (Oral)  Resp 20  Ht  (1.651 m)  Wt 74.844 kg  BMI 27.46 kg/m2  SpO2 100%  LMP 01/15/2016 Physical Exam  Constitutional: She is oriented to person, place, and time. She appears well-developed and well-nourished.  Non-toxic appearance.  HENT:  Head: Normocephalic.  Right Ear: Tympanic membrane and external ear normal.  Left Ear: Tympanic membrane and external ear normal.  Eyes: EOM and lids are normal. Pupils are equal, round, and reactive to light.  Neck: Normal range of motion. Neck supple. Carotid bruit is not present.  Cardiovascular: Normal rate, regular rhythm, normal heart sounds, intact distal pulses and normal pulses.   Pulmonary/Chest: Breath sounds normal. No respiratory distress.  Abdominal: Soft. Bowel sounds are normal. There is no tenderness. There is no guarding.  Musculoskeletal:       Cervical back: She exhibits normal range of motion and no tenderness.       Lumbar back: She exhibits decreased range of motion, tenderness, pain and spasm.  Lymphadenopathy:       Head (right side): No submandibular adenopathy present.  Head (left side): No submandibular adenopathy present.    She has no cervical adenopathy.  Neurological: She is alert and oriented to person, place, and time. She has normal strength. No cranial nerve deficit or sensory deficit. She exhibits normal muscle tone. Coordination normal.  Gait is steady. No foot drop. No acute motor or sensory deficits appreciated.  Skin: Skin is  warm and dry.  Psychiatric: She has a normal mood and affect. Her speech is normal.  Nursing note and vitals reviewed.   ED Course  Procedures (including critical care time) Labs Review Labs Reviewed - No data to display  Imaging Review No results found. I have personally reviewed and evaluated these images and lab results as part of my medical decision-making.   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 The patient will be treated with intramuscular Dilaudid. Crutches were offered, but patient declines at this time. Patient is referred to Dr. August Saucerean for orthopedic evaluation and management. Prescription for Percocet, Mobic, and Flexeril given to the patient. The patient is asked to use heat to the lower back and to rest her lower back is much as possible. Work note has also been given for the patient to use until she is seen by Dr. August Saucerean.    Final diagnoses:  None    *I have reviewed nursing notes, vital signs, and all appropriate lab and imaging results for this patient.[address]**    Kari Dupriest, PA-C 02/05/16 2345  Donnetta HutchingBrian Cook, MD 02/08/16 1101

## 2016-02-06 MED FILL — MELOXICAM 15 MG TABLET: 15 | 7 days supply | Qty: 7 | Fill #0

## 2016-02-06 MED FILL — CYCLOBENZAPRINE 10 MG TAB: 10 | 6 days supply | Qty: 20 | Fill #0

## 2016-02-06 MED FILL — DEXAMETHASONE 4 MG TABLET: 4 | 6 days supply | Qty: 12 | Fill #0

## 2016-02-10 MED FILL — MELOXICAM 15 MG TABLET: 15 | 30 days supply | Qty: 30 | Fill #0

## 2016-02-21 MED FILL — AMITRIPTYLINE HCL 10 MG TAB: 10 | 30 days supply | Qty: 30 | Fill #0

## 2016-03-02 MED FILL — predniSONE 20 MG TABS: 20 | 6 days supply | Qty: 12 | Fill #0

## 2016-03-02 MED FILL — AMITRIPTYLINE HCL 25 MG TAB: 25 | 30 days supply | Qty: 30 | Fill #0

## 2016-03-02 MED FILL — CYCLOBENZAPRINE 10 MG TAB: 10 | 30 days supply | Qty: 30 | Fill #0

## 2016-03-26 MED FILL — AMOX-CLAV 875-125 MG TABLET: 875-125 | 10 days supply | Qty: 20 | Fill #0

## 2016-04-15 MED FILL — CYCLOBENZAPRINE 10 MG TAB: 10 | 30 days supply | Qty: 30 | Fill #1

## 2016-06-22 MED FILL — MUPIROCIN 2% OINTMENT: 2 | 5 days supply | Qty: 22 | Fill #0

## 2016-07-21 MED FILL — AMITRIPTYLINE HCL 25 MG TAB: 25 | 30 days supply | Qty: 30 | Fill #1

## 2016-07-21 MED FILL — AMITRIPTYLINE HCL 10 MG TAB: 10 | 30 days supply | Qty: 30 | Fill #1

## 2016-09-14 MED FILL — CYCLOBENZAPRINE 10 MG TAB: 10 | 30 days supply | Qty: 90 | Fill #0

## 2016-09-14 MED FILL — predniSONE 20 MG TABS: 20 | 6 days supply | Qty: 12 | Fill #0

## 2016-10-02 ENCOUNTER — Encounter: Payer: Self-pay | Admitting: Advanced Practice Midwife

## 2016-10-02 NOTE — Progress Notes (Unsigned)
3 samples of Nuva Ring given

## 2016-10-13 ENCOUNTER — Other Ambulatory Visit: Payer: Self-pay | Admitting: Advanced Practice Midwife

## 2016-10-13 MED ORDER — CIPROFLOXACIN HCL 500 MG PO TABS: 500.0000 mg | ORAL_TABLET | Freq: Two times a day (BID) | ORAL | 1 refills | Status: DC

## 2016-10-13 MED ORDER — PHENAZOPYRIDINE HCL 200 MG PO TABS: 200.0000 mg | ORAL_TABLET | Freq: Three times a day (TID) | ORAL | 0 refills | Status: DC | PRN

## 2016-10-13 NOTE — Progress Notes (Signed)
Severe dysuria. Rx cipro 500mg  BID X3 and pyridium 200mg  TID prn

## 2016-10-14 ENCOUNTER — Other Ambulatory Visit: Payer: Self-pay | Admitting: Advanced Practice Midwife

## 2016-10-14 MED ORDER — SULFAMETHOXAZOLE-TRIMETHOPRIM 800-160 MG PO TABS: 1.0000 | ORAL_TABLET | Freq: Two times a day (BID) | ORAL | 0 refills | Status: DC

## 2016-10-14 NOTE — Progress Notes (Signed)
Developed red dots all over face about 30  Minutes after taking Cipro.  Stop taking and Rx septra DS BID X5

## 2017-01-06 MED FILL — AMITRIPTYLINE HCL 25 MG TAB: 25 | 30 days supply | Qty: 30 | Fill #2

## 2017-04-16 ENCOUNTER — Other Ambulatory Visit: Payer: Self-pay | Admitting: Advanced Practice Midwife

## 2017-04-19 ENCOUNTER — Other Ambulatory Visit: Payer: Self-pay | Admitting: *Deleted

## 2017-04-20 MED ORDER — PHENAZOPYRIDINE HCL 200 MG PO TABS: 200.0000 mg | ORAL_TABLET | Freq: Three times a day (TID) | ORAL | 3 refills | Status: DC | PRN

## 2017-07-02 ENCOUNTER — Encounter: Payer: Self-pay | Admitting: Genetics

## 2017-07-15 ENCOUNTER — Encounter: Payer: 59 | Admitting: Genetics

## 2017-07-15 ENCOUNTER — Other Ambulatory Visit: Payer: 59

## 2018-01-17 ENCOUNTER — Ambulatory Visit: Payer: 59 | Admitting: Physician Assistant

## 2018-01-17 DIAGNOSIS — Z0189 Encounter for other specified special examinations: Secondary | ICD-10-CM

## 2018-03-01 MED FILL — ESCITALOPRAM 10 MG TABLET: 10 | 33 days supply | Qty: 60 | Fill #0

## 2018-03-01 MED FILL — TEMAZEPAM 30 MG CAPSULE: 30 | 30 days supply | Qty: 30 | Fill #0

## 2018-03-17 MED FILL — ESTARYLLA 0.25-35 MG-MCG TA: 0.25-35 | 84 days supply | Qty: 84 | Fill #0

## 2018-03-22 MED FILL — CLINDAMYCIN PHOSP 1% LOTION: 1 | 30 days supply | Qty: 60 | Fill #0

## 2018-03-22 MED FILL — ESCITALOPRAM 20 MG TABLET: 20 | 90 days supply | Qty: 90 | Fill #0

## 2018-03-22 MED FILL — buPROPion HCL ER (XL) 150 M: 150 | 30 days supply | Qty: 30 | Fill #0

## 2018-04-17 ENCOUNTER — Ambulatory Visit (INDEPENDENT_AMBULATORY_CARE_PROVIDER_SITE_OTHER): Payer: Self-pay | Admitting: Family Medicine

## 2018-04-17 VITALS — BP 124/90 | HR 108 | Temp 99.2°F | Resp 18 | Wt 193.0 lb

## 2018-04-17 DIAGNOSIS — R059 Cough, unspecified: Secondary | ICD-10-CM

## 2018-04-17 DIAGNOSIS — R05 Cough: Secondary | ICD-10-CM

## 2018-04-17 DIAGNOSIS — R0989 Other specified symptoms and signs involving the circulatory and respiratory systems: Secondary | ICD-10-CM

## 2018-04-17 MED ORDER — LEVOCETIRIZINE DIHYDROCHLORIDE 5 MG PO TABS: 5.0000 mg | ORAL_TABLET | Freq: Every evening | ORAL | 0 refills | Status: DC

## 2018-04-17 MED ORDER — ALBUTEROL SULFATE 108 (90 BASE) MCG/ACT IN AEPB: 2.0000 | INHALATION_SPRAY | Freq: Four times a day (QID) | RESPIRATORY_TRACT | 0 refills | Status: DC | PRN

## 2018-04-17 MED ORDER — BENZONATATE 100 MG PO CAPS: 100.0000 mg | ORAL_CAPSULE | Freq: Three times a day (TID) | ORAL | 0 refills | Status: DC | PRN

## 2018-04-17 MED ORDER — PREDNISONE 10 MG PO TABS: ORAL_TABLET | ORAL | 0 refills | Status: DC

## 2018-04-17 NOTE — Progress Notes (Signed)
Patient ID: Kari Reilly, female    DOB: 15-Sep-1996, 22 y.o.   MRN: [id number]  PCP: Louie Boston., MD  Chief Complaint  Patient presents with  . productive cough    Subjective:  HPICiera B Reilly is a 22 y.o. female presents for evaluation productive cough with chest tightness. No prior history of asthma or persistent seasonal allergies. Cough present for 1-2 days. She has had one day of yellowish sputum production. With cough, she experiences some chest tightness. She has a low grade fever today., however has not measured temperature prior to today's visit. Experiences some nasal drainage and nasal stuffiness. Denies wheezing, shortness of breath, sore throat,fatigue, or body aches. Social History   Socioeconomic History  . Marital status: Single    Spouse name: Not on file  . Number of children: Not on file  . Years of education: Not on file  . Highest education level: Not on file  Occupational History  . Not on file  Social Needs  . Financial resource strain: Not on file  . Food insecurity:    Worry: Not on file    Inability: Not on file  . Transportation needs:    Medical: Not on file    Non-medical: Not on file  Tobacco Use  . Smoking status: Never Smoker  Substance and Sexual Activity  . Alcohol use: No  . Drug use: No  . Sexual activity: Not on file  Lifestyle  . Physical activity:    Days per week: Not on file    Minutes per session: Not on file  . Stress: Not on file  Relationships  . Social connections:    Talks on phone: Not on file    Gets together: Not on file    Attends religious service: Not on file    Active member of club or organization: Not on file    Attends meetings of clubs or organizations: Not on file    Relationship status: Not on file  . Intimate partner violence:    Fear of current or ex partner: Not on file    Emotionally abused: Not on file    Physically abused: Not on file    Forced sexual activity: Not on file  Other Topics  Concern  . Not on file  Social History Narrative  . Not on file    No family history on file.   Review of Systems Pertinent negatives listed in HPI   There are no active problems to display for this patient.  Allergies  Allergen Reactions  . Ciprofloxacin Hcl Hives  . Codeine Other (See Comments)    "acts crazy"  . Clarithromycin Rash    Prior to Admission medications   Medication Sig Start Date End Date Taking? Authorizing Provider  buPROPion (WELLBUTRIN XL) 300 MG 24 hr tablet Take 300 mg by mouth daily.   Yes [provider]  cyclobenzaprine (FLEXERIL) 10 MG tablet Take 1 tablet (10 mg total) by mouth 3 (three) times daily. 02/05/16  Yes Ivery Quale, PA-C  escitalopram (LEXAPRO) 20 MG tablet Take 20 mg by mouth daily.   Yes [provider]  etonogestrel-ethinyl estradiol (NUVARING) 0.12-0.015 MG/24HR vaginal ring Place 1 each vaginally every 28 (twenty-eight) days. Insert vaginally and leave in place for 3 consecutive weeks, then remove for 1 week.   Yes [provider]  temazepam (RESTORIL) 22.5 MG capsule Take 30 mg by mouth at bedtime as needed for sleep.   Yes [provider]  dexamethasone (  DECADRON) 4 MG tablet Take 1 tablet (4 mg total) by mouth 2 (two) times daily with a meal. Patient not taking: Reported on 04/17/2018 02/05/16   Ivery QualeBryant, Hobson, PA-C  meloxicam (MOBIC) 15 MG tablet Take 1 tablet (15 mg total) by mouth daily. Patient not taking: Reported on 04/17/2018 02/05/16   Ivery QualeBryant, Hobson, PA-C  phenazopyridine (PYRIDIUM) 200 MG tablet Take 1 tablet (200 mg total) by mouth 3 (three) times daily as needed for pain. Patient not taking: Reported on 04/17/2018 04/20/17   Cresenzo-Dishmon, Scarlette CalicoFrances, CNM  sulfamethoxazole-trimethoprim (BACTRIM DS,SEPTRA DS) 800-160 MG tablet Take 1 tablet by mouth 2 (two) times daily. Patient not taking: Reported on 04/17/2018 10/14/16   Jacklyn Shellresenzo-Dishmon, Frances, CNM    Past Medical, Surgical Family and Social  History reviewed and updated.    Objective:   Today's Vitals   04/17/18 1149  BP: 124/90  Pulse: (!) 108  Resp: 18  Temp: 99.2 F (37.3 C)  TempSrc: Oral  SpO2: 95%  Weight: 193 lb (87.5 kg)    Wt Readings from Last 3 Encounters:  04/17/18 193 lb (87.5 kg)  02/05/16 165 lb (74.8 kg)  01/08/12 140 lb (63.5 kg) (79 %, Z= 0.82)*   * Growth percentiles are based on CDC (Girls, 2-20 Years) data.   Physical Exam  Constitutional: She is oriented to person, place, and time. She appears well-developed and well-nourished.  HENT:  Head: Normocephalic and atraumatic.  Eyes: Pupils are equal, round, and reactive to light.  Cardiovascular: Regular rhythm. Tachycardia present.  Pulmonary/Chest: Effort normal. She has no wheezes. She has no rales. She exhibits tenderness.  Lymphadenopathy:    She has no cervical adenopathy.  Neurological: She is alert and oriented to person, place, and time.  Psychiatric: She has a normal mood and affect. Her behavior is normal. Judgment and thought content normal.           Assessment & Plan:  1. Cough 2. Chest congestion Symptomatic treatment warranted only. 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 Will trial a short course prednisone taper, albuterol inhaler, levocetirizine, and benzonatate. If symptoms worsen, may consider antibiotic therapy. Heart rate accelerated today, likely secondary to dehydration. Encouraged intake of water and avoidance of caffeinated beverages until heart rate normalizes.   Meds ordered this encounter  Medications  . predniSONE (DELTASONE) 10 MG tablet    Sig: Take 4 tablets X 1 day, Take 2 tablets x 2, Take 1 tablets x 2    Dispense:  10 tablet    Refill:  0  . Albuterol Sulfate (PROAIR RESPICLICK) 108 (90 Base) MCG/ACT AEPB    Sig: Inhale 2 puffs into the lungs every 6 (six) hours as needed.    Dispense:  1 each    Refill:  0  . benzonatate (TESSALON) 100 MG capsule    Sig: Take 1-2  capsules (100-200 mg total) by mouth 3 (three) times daily as needed for cough.    Dispense:  40 capsule    Refill:  0  . levocetirizine (XYZAL) 5 MG tablet    Sig: Take 1 tablet (5 mg total) by mouth every evening.    Dispense:  30 tablet    Refill:  0   If symptoms worsen or do not improve, return for follow-up, follow-up with PCP, or at the emergency department if severity of symptoms warrant a higher level of care.    Godfrey PickKimberly S. Tiburcio PeaHarris, MSN, FNP-C [facility]  [address]  [place], [place] [id number]  [phone]    

## 2018-04-17 NOTE — Patient Instructions (Signed)
Cough, Adult A cough helps to clear your throat and lungs. A cough may last only 2-3 weeks (acute), or it may last longer than 8 weeks (chronic). Many different things can cause a cough. A cough may be a sign of an illness or another medical condition. Follow these instructions at home:  Pay attention to any changes in your cough.  Take medicines only as told by your doctor. ? If you were prescribed an antibiotic medicine, take it as told by your doctor. Do not stop taking it even if you start to feel better. ? Talk with your doctor before you try using a cough medicine.  Drink enough fluid to keep your pee (urine) clear or pale yellow.  If the air is dry, use a cold steam vaporizer or humidifier in your home.  Stay away from things that make you cough at work or at home.  If your cough is worse at night, try using extra pillows to raise your head up higher while you sleep.  Do not smoke, and try not to be around smoke. If you need help quitting, ask your doctor.  Do not have caffeine.  Do not drink alcohol.  Rest as needed. Contact a doctor if:  You have new problems (symptoms).  You cough up yellow fluid (pus).  Your cough does not get better after 2-3 weeks, or your cough gets worse.  Medicine does not help your cough and you are not sleeping well.  You have pain that gets worse or pain that is not helped with medicine.  You have a fever.  You are losing weight and you do not know why.  You have night sweats. Get help right away if:  You cough up blood.  You have trouble breathing.  Your heartbeat is very fast. This information is not intended to replace advice given to you by your health care provider. Make sure you discuss any questions you have with your health care provider. Document Released: 07/16/2011 Document Revised: 04/09/2016 Document Reviewed: 01/09/2015 Elsevier Interactive Patient Education  2018 [employer]. Allergies An allergy is when your  body reacts to a substance in a way that is not normal. An allergic reaction can happen after you:  Eat something.  Breathe in something.  Touch something.  You can be allergic to:  Things that are only around during certain seasons, like molds and pollens.  Foods.  Drugs.  Insects.  Animal dander.  What are the signs or symptoms?  Puffiness (swelling). This may happen on the lips, face, tongue, mouth, or throat.  Sneezing.  Coughing.  Breathing loudly (wheezing).  Stuffy nose.  Tingling in the mouth.  A rash.  Itching.  Itchy, red, puffy areas of skin (hives).  Watery eyes.  Throwing up (vomiting).  Watery poop (diarrhea).  Dizziness.  Feeling faint or fainting.  Trouble breathing or swallowing.  A tight feeling in the chest.  A fast heartbeat. How is this diagnosed? Allergies can be diagnosed with:  A medical and family history.  Skin tests.  Blood tests.  A food diary. A food diary is a record of all the foods, drinks, and symptoms you have each day.  The results of an elimination diet. This diet involves making sure not to eat certain foods and then seeing what happens when you start eating them again.  How is this treated? There is no cure for allergies, but allergic reactions can be treated with medicine. Severe reactions usually need to be treated at  a hospital. How is this prevented? The best way to prevent an allergic reaction is to avoid the thing you are allergic to. Allergy shots and medicines can also help prevent reactions in some cases. This information is not intended to replace advice given to you by your health care provider. Make sure you discuss any questions you have with your health care provider. Document Released: 02/27/2013 Document Revised: 06/29/2016 Document Reviewed: 08/14/2014 Elsevier Interactive Patient Education  2018 [employer] Viral Respiratory Infection A viral respiratory infection is an illness that  affects parts of the body used for breathing, like the lungs, nose, and throat. It is caused by a germ called a virus. Some examples of this kind of infection are:  A cold.  The flu (influenza).  A respiratory syncytial virus (RSV) infection.  How do I know if I have this infection? Most of the time this infection causes:  A stuffy or runny nose.  Yellow or green fluid in the nose.  A cough.  Sneezing.  Tiredness (fatigue).  Achy muscles.  A sore throat.  Sweating or chills.  A fever.  A headache.  How is this infection treated? If the flu is diagnosed early, it may be treated with an antiviral medicine. This medicine shortens the length of time a person has symptoms. Symptoms may be treated with over-the-counter and prescription medicines, such as:  Expectorants. These make it easier to cough up mucus.  Decongestant nasal sprays.  Doctors do not prescribe antibiotic medicines for viral infections. They do not work with this kind of infection. How do I know if I should stay home? To keep others from getting sick, stay home if you have:  A fever.  A lasting cough.  A sore throat.  A runny nose.  Sneezing.  Muscles aches.  Headaches.  Tiredness.  Weakness.  Chills.  Sweating.  An upset stomach (nausea).  Follow these instructions at home:  Rest as much as possible.  Take over-the-counter and prescription medicines only as told by your doctor.  Drink enough fluid to keep your pee (urine) clear or pale yellow.  Gargle with salt water. Do this 3-4 times per day or as needed. To make a salt-water mixture, dissolve -1 tsp of salt in 1 cup of warm water. Make sure the salt dissolves all the way.  Use nose drops made from salt water. This helps with stuffiness (congestion). It also helps soften the skin around your nose.  Do not drink alcohol.  Do not use tobacco products, including cigarettes, chewing tobacco, and e-cigarettes. If you need  help quitting, ask your doctor. Get help if:  Your symptoms last for 10 days or longer.  Your symptoms get worse over time.  You have a fever.  You have very bad pain in your face or forehead.  Parts of your jaw or neck become very swollen. Get help right away if:  You feel pain or pressure in your chest.  You have shortness of breath.  You faint or feel like you will faint.  You keep throwing up (vomiting).  You feel confused. This information is not intended to replace advice given to you by your health care provider. Make sure you discuss any questions you have with your health care provider. Document Released: 10/15/2008 Document Revised: 04/09/2016 Document Reviewed: 04/10/2015 Elsevier Interactive Patient Education  2018 [employer]

## 2018-04-18 MED FILL — TEMAZEPAM 30 MG CAPSULE: 30 | 30 days supply | Qty: 30 | Fill #0

## 2018-04-18 MED FILL — buPROPion HCL ER (XL) 150 M: 150 | 90 days supply | Qty: 90 | Fill #0

## 2018-04-18 MED FILL — VENTOLIN HFA 90 MCG INHALER: 108 (90 BAS | 16 days supply | Qty: 18 | Fill #0

## 2018-04-19 ENCOUNTER — Ambulatory Visit (INDEPENDENT_AMBULATORY_CARE_PROVIDER_SITE_OTHER): Payer: Self-pay | Admitting: Family Medicine

## 2018-04-19 VITALS — BP 116/80 | HR 121 | Temp 98.9°F | Resp 17 | Wt 193.8 lb

## 2018-04-19 DIAGNOSIS — R05 Cough: Secondary | ICD-10-CM

## 2018-04-19 DIAGNOSIS — R059 Cough, unspecified: Secondary | ICD-10-CM

## 2018-04-19 DIAGNOSIS — J40 Bronchitis, not specified as acute or chronic: Secondary | ICD-10-CM

## 2018-04-19 DIAGNOSIS — R062 Wheezing: Secondary | ICD-10-CM

## 2018-04-19 MED ORDER — MONTELUKAST SODIUM 10 MG PO TABS: 10.0000 mg | ORAL_TABLET | Freq: Every day | ORAL | 0 refills | Status: DC

## 2018-04-19 MED ORDER — PREDNISONE 20 MG PO TABS: 40.0000 mg | ORAL_TABLET | Freq: Every day | ORAL | 0 refills | Status: AC

## 2018-04-19 MED ORDER — AMOXICILLIN-POT CLAVULANATE 875-125 MG PO TABS: 1.0000 | ORAL_TABLET | Freq: Two times a day (BID) | ORAL | 0 refills | Status: DC

## 2018-04-19 MED FILL — predniSONE 20 MG TABS: 20 | 2 days supply | Qty: 4 | Fill #0

## 2018-04-19 MED FILL — AMOX TR-K CLV 875-125 MG TA: 875-125 | 10 days supply | Qty: 20 | Fill #0

## 2018-04-19 MED FILL — MONTELUKAST SOD 10 MG TAB: 10 | 14 days supply | Qty: 14 | Fill #0

## 2018-04-19 NOTE — Patient Instructions (Signed)

## 2018-04-19 NOTE — Progress Notes (Signed)
Kari Reilly is a 22 y.o. female who presents today with concerns of cough, chills and feeling of unwell. Increased use of inhaler and no response from OTC and prescription cough medications.  Review of Systems  Constitutional: Positive for chills and malaise/fatigue. Negative for fever.  HENT: Positive for congestion and sore throat. Negative for ear discharge, ear pain and sinus pain.   Eyes: Negative.   Respiratory: Positive for cough. Negative for sputum production.   Cardiovascular: Negative.  Negative for chest pain.  Gastrointestinal: Negative for abdominal pain, diarrhea, nausea and vomiting.  Genitourinary: Negative for dysuria, frequency, hematuria and urgency.  Musculoskeletal: Negative for myalgias.  Skin: Negative.   Neurological: Negative for headaches.  Endo/Heme/Allergies: Negative.   Psychiatric/Behavioral: Negative.     O: Vitals:   04/19/18 1521  BP: (!) 124/100  Pulse: (!) 141  Resp: 17  Temp: 98.9 F (37.2 C)  SpO2: 98%   Vitals:   04/19/18 1521 04/19/18 1540  BP: (!) 124/100 116/80  Pulse: (!) 141 (!) 121  Resp: 17   Temp: 98.9 F (37.2 C)   SpO2: 98%      Physical Exam  Constitutional: She is oriented to person, place, and time. She appears well-developed and well-nourished. She is active.  Non-toxic appearance. She does not have a sickly appearance. She appears ill.  HENT:  Head: Normocephalic.  Right Ear: Hearing, tympanic membrane, external ear and ear canal normal.  Left Ear: Hearing, tympanic membrane, external ear and ear canal normal.  Nose: Nose normal.  Mouth/Throat: Uvula is midline and oropharynx is clear and moist.  Neck: Normal range of motion. Neck supple.  Cardiovascular: Regular rhythm, normal heart sounds and normal pulses. Tachycardia present.  Pulmonary/Chest: Effort normal. She has wheezes in the right lower field and the left lower field. She has rhonchi in the right lower field and the left lower field.  Abdominal: Soft.  Bowel sounds are normal.  Musculoskeletal: Normal range of motion.  Lymphadenopathy:       Head (right side): No submental and no submandibular adenopathy present.       Head (left side): No submental and no submandibular adenopathy present.    She has no cervical adenopathy.  Neurological: She is alert and oriented to person, place, and time.  Psychiatric: She has a normal mood and affect.  Vitals reviewed.    A: 1. Bronchitis   2. Cough   3. Wheezing      P: Patient returns to work on Friday will provide note if symptoms unimproved at that time. Exam findings, diagnosis etiology and medication use and indications reviewed with patient. Follow- Up and discharge instructions provided. No emergent/urgent issues found on exam.  Patient verbalized understanding of information provided and agrees with plan of care (POC), all questions answered.  1. Bronchitis - amoxicillin-clavulanate (AUGMENTIN) 875-125 MG tablet; Take 1 tablet by mouth 2 (two) times daily.  2. Cough - montelukast (SINGULAIR) 10 MG tablet; Take 1 tablet (10 mg total) by mouth at bedtime.  3. Wheezing - predniSONE (DELTASONE) 20 MG tablet; Take 2 tablets (40 mg total) by mouth daily with breakfast for 2 days.

## 2018-05-14 ENCOUNTER — Other Ambulatory Visit: Payer: Self-pay | Admitting: Family Medicine

## 2018-06-03 MED FILL — CLINDAMYCIN PHOSP 1% LOTION: 1 | 30 days supply | Qty: 60 | Fill #1

## 2018-06-03 MED FILL — FEMYNOR 0.25-35 MG-MCG TABS: 0.25-35 | 84 days supply | Qty: 84 | Fill #1

## 2018-07-01 ENCOUNTER — Other Ambulatory Visit: Payer: Self-pay

## 2018-07-01 ENCOUNTER — Emergency Department (HOSPITAL_BASED_OUTPATIENT_CLINIC_OR_DEPARTMENT_OTHER)
Admission: EM | Admit: 2018-07-01 | Discharge: 2018-07-01 | Discharge status: Against Medical Advice | Payer: 59 | Attending: Emergency Medicine | Admitting: Emergency Medicine

## 2018-07-01 ENCOUNTER — Encounter (HOSPITAL_BASED_OUTPATIENT_CLINIC_OR_DEPARTMENT_OTHER): Payer: Self-pay

## 2018-07-01 DIAGNOSIS — Z5321 Procedure and treatment not carried out due to patient leaving prior to being seen by health care provider: Secondary | ICD-10-CM | POA: Insufficient documentation

## 2018-07-01 DIAGNOSIS — M545 Low back pain: Secondary | ICD-10-CM | POA: Insufficient documentation

## 2018-07-01 NOTE — ED Notes (Addendum)
Pt offered a hallway bed  To be taken back now,  Or she could wait longer for a room, pt said " that's ridiculous ",   Pt told she can have a seat in lobby and well call you when we have a room.

## 2018-07-01 NOTE — ED Triage Notes (Signed)
Pt c/o lower back pain that radiates down both legs x today-hx of same-NAD-steady gait

## 2018-10-29 ENCOUNTER — Emergency Department (HOSPITAL_BASED_OUTPATIENT_CLINIC_OR_DEPARTMENT_OTHER)
Admission: EM | Admit: 2018-10-29 | Discharge: 2018-10-29 | Disposition: A | Discharge status: Home / Self Care | Payer: Self-pay | Attending: Emergency Medicine | Admitting: Emergency Medicine

## 2018-10-29 ENCOUNTER — Other Ambulatory Visit: Payer: Self-pay

## 2018-10-29 DIAGNOSIS — J029 Acute pharyngitis, unspecified: Secondary | ICD-10-CM | POA: Insufficient documentation

## 2018-10-29 DIAGNOSIS — Z5321 Procedure and treatment not carried out due to patient leaving prior to being seen by health care provider: Secondary | ICD-10-CM | POA: Insufficient documentation

## 2018-10-29 LAB — GROUP A STREP BY PCR: Group A Strep by PCR: NOT DETECTED

## 2018-10-29 NOTE — ED Triage Notes (Signed)
Patient states " I have strep throat" - patient states that she gets it frequently and knows the signs. Denies a known fever at this time - reports that she started to have a sore throat this am and has not taken her temperature but has had chills toady - she also reports white patches

## 2019-09-01 ENCOUNTER — Other Ambulatory Visit: Payer: Self-pay | Admitting: Family Medicine

## 2019-09-01 DIAGNOSIS — S4991XA Unspecified injury of right shoulder and upper arm, initial encounter: Secondary | ICD-10-CM

## 2019-09-01 DIAGNOSIS — W19XXXA Unspecified fall, initial encounter: Secondary | ICD-10-CM

## 2019-09-03 ENCOUNTER — Other Ambulatory Visit: Payer: Self-pay

## 2019-09-03 ENCOUNTER — Ambulatory Visit (INDEPENDENT_AMBULATORY_CARE_PROVIDER_SITE_OTHER): Payer: BLUE CROSS/BLUE SHIELD

## 2019-09-03 DIAGNOSIS — W19XXXA Unspecified fall, initial encounter: Secondary | ICD-10-CM

## 2019-09-03 DIAGNOSIS — S4991XA Unspecified injury of right shoulder and upper arm, initial encounter: Secondary | ICD-10-CM

## 2019-12-08 ENCOUNTER — Ambulatory Visit: Payer: Managed Care, Other (non HMO) | Attending: Internal Medicine

## 2019-12-08 ENCOUNTER — Other Ambulatory Visit: Payer: BLUE CROSS/BLUE SHIELD

## 2019-12-08 ENCOUNTER — Other Ambulatory Visit: Payer: Self-pay

## 2019-12-08 DIAGNOSIS — Z20822 Contact with and (suspected) exposure to covid-19: Secondary | ICD-10-CM | POA: Insufficient documentation

## 2019-12-09 ENCOUNTER — Other Ambulatory Visit: Payer: Self-pay

## 2019-12-09 ENCOUNTER — Emergency Department (HOSPITAL_COMMUNITY)
Admission: EM | Admit: 2019-12-09 | Discharge: 2019-12-09 | Disposition: A | Discharge status: Home / Self Care | Payer: Self-pay | Attending: Emergency Medicine | Admitting: Emergency Medicine

## 2019-12-09 ENCOUNTER — Encounter (HOSPITAL_COMMUNITY): Payer: Self-pay | Admitting: *Deleted

## 2019-12-09 ENCOUNTER — Emergency Department (HOSPITAL_COMMUNITY): Payer: Self-pay

## 2019-12-09 DIAGNOSIS — J45901 Unspecified asthma with (acute) exacerbation: Secondary | ICD-10-CM | POA: Insufficient documentation

## 2019-12-09 DIAGNOSIS — J40 Bronchitis, not specified as acute or chronic: Secondary | ICD-10-CM

## 2019-12-09 DIAGNOSIS — Z79899 Other long term (current) drug therapy: Secondary | ICD-10-CM | POA: Insufficient documentation

## 2019-12-09 DIAGNOSIS — Z91040 Latex allergy status: Secondary | ICD-10-CM | POA: Insufficient documentation

## 2019-12-09 DIAGNOSIS — R0602 Shortness of breath: Secondary | ICD-10-CM

## 2019-12-09 DIAGNOSIS — Z20822 Contact with and (suspected) exposure to covid-19: Secondary | ICD-10-CM | POA: Insufficient documentation

## 2019-12-09 HISTORY — DX: Polycystic ovarian syndrome: E28.2

## 2019-12-09 LAB — CBC WITH DIFFERENTIAL/PLATELET
Abs Immature Granulocytes: 0.02 10*3/uL (ref 0.00–0.07)
Basophils Absolute: 0 10*3/uL (ref 0.0–0.1)
Basophils Relative: 0 %
Eosinophils Absolute: 0.1 10*3/uL (ref 0.0–0.5)
Eosinophils Relative: 1 %
HCT: 41.4 % (ref 36.0–46.0)
Hemoglobin: 13.3 g/dL (ref 12.0–15.0)
Immature Granulocytes: 0 %
Lymphocytes Relative: 26 %
Lymphs Abs: 2.2 10*3/uL (ref 0.7–4.0)
MCH: 29.7 pg (ref 26.0–34.0)
MCHC: 32.1 g/dL (ref 30.0–36.0)
MCV: 92.4 fL (ref 80.0–100.0)
Monocytes Absolute: 0.6 10*3/uL (ref 0.1–1.0)
Monocytes Relative: 7 %
Neutro Abs: 5.6 10*3/uL (ref 1.7–7.7)
Neutrophils Relative %: 66 %
Platelets: 300 10*3/uL (ref 150–400)
RBC: 4.48 MIL/uL (ref 3.87–5.11)
RDW: 12.2 % (ref 11.5–15.5)
WBC: 8.4 10*3/uL (ref 4.0–10.5)
nRBC: 0 % (ref 0.0–0.2)

## 2019-12-09 LAB — BASIC METABOLIC PANEL
Anion gap: 8 (ref 5–15)
BUN: 14 mg/dL (ref 6–20)
CO2: 26 mmol/L (ref 22–32)
Calcium: 9 mg/dL (ref 8.9–10.3)
Chloride: 106 mmol/L (ref 98–111)
Creatinine, Ser: 0.75 mg/dL (ref 0.44–1.00)
GFR calc Af Amer: >60 mL/min (ref >60)
GFR calc non Af Amer: >60 mL/min (ref >60)
Glucose, Bld: 101 mg/dL — ABNORMAL HIGH (ref 70–99)
Potassium: 4.3 mmol/L (ref 3.5–5.1)
Sodium: 140 mmol/L (ref 135–145)

## 2019-12-09 LAB — D-DIMER, QUANTITATIVE: D-Dimer, Quant: 0.47 ug/mL-FEU (ref 0.00–0.50)

## 2019-12-09 LAB — RESPIRATORY PANEL BY RT PCR (FLU A&B, COVID)
Influenza A by PCR: NEGATIVE
Influenza B by PCR: NEGATIVE
SARS Coronavirus 2 by RT PCR: NEGATIVE

## 2019-12-09 LAB — NOVEL CORONAVIRUS, NAA: SARS-CoV-2, NAA: NOT DETECTED

## 2019-12-09 MED ORDER — PREDNISONE 20 MG PO TABS: ORAL_TABLET | ORAL | 0 refills | Status: DC

## 2019-12-09 MED ORDER — PREDNISONE 20 MG PO TABS
60.0000 mg | ORAL_TABLET | Freq: Once | ORAL | Status: AC
  Administered 2019-12-09: 60 mg via ORAL
  Filled 2019-12-09: qty 3

## 2019-12-09 NOTE — ED Triage Notes (Signed)
States for 3 days she has had SNOB, congestion, headache, fever (100.4) max. Fatigue body aches, tested at [facility] yesterday, results pending. Rest non labored in triage

## 2019-12-09 NOTE — Discharge Instructions (Signed)
Use your inhaler as directed.  Complete the course of steroids.  Follow-up with your primary care physician if your symptoms are not improving by Monday.  Return here as needed if you have any worsening symptoms.

## 2019-12-09 NOTE — ED Provider Notes (Signed)
[facility] DEPT Provider Note   CSN: [id number] Arrival date & time: 12/09/19  1719     History Chief Complaint  Patient presents with  . Headache  . Fever  . Shortness of Breath  . Cough  . Nasal Congestion    Alvie B Laton is a 24 y.o. female.  Patient is a 24 year old female who presents with fever and shortness of breath.  She has had a 3-day history of myalgias associated with intermittent fevers and rhinorrhea with coughing.  Today she started feeling more short of breath.  She feels like she cannot quite catch her breath.  She does have a history of asthma although she does not typically have problems with it.  She has been using her inhaler today without much improvement in her shortness of breath.  She has some chest tightness but denies any pleuritic type chest pain.  No leg pain or swelling.  She had an outpatient Covid test yesterday that just resulted today is negative.  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        Past Medical History:  Diagnosis Date  . Asthma   . PCOS (polycystic ovarian syndrome)     There are no problems to display for this patient.   Past Surgical History:  Procedure Laterality Date  . ADENOIDECTOMY    . TYMPANOSTOMY TUBE PLACEMENT       OB History   No obstetric history on file.     No family history on file.  Social History   Tobacco Use  . Smoking status: Never Smoker  . Smokeless tobacco: Never Used  Substance Use Topics  . Alcohol use: No  . Drug use: No    Home Medications Prior to Admission medications   Medication Sig Start Date End Date Taking? Authorizing Provider  Albuterol Sulfate (PROAIR RESPICLICK) 509 (90 Base) MCG/ACT AEPB Inhale 2 puffs into the lungs every 6 (six) hours as needed. Patient taking differently: Inhale 2 puffs into the lungs every 6 (six) hours as needed (wheezing/shortness of breath).  04/17/18  Yes Scot Jun, FNP  amoxicillin-clavulanate (AUGMENTIN) 875-125 MG tablet Take 1 tablet by mouth 2 (two) times daily. Patient not taking: Reported on 12/09/2019 04/19/18   Shella Maxim, NP  benzonatate (TESSALON) 100 MG capsule Take 1-2 capsules (100-200 mg total) by mouth 3 (three) times daily as needed for cough. Patient not taking: Reported on 12/09/2019 04/17/18   Scot Jun, FNP  cyclobenzaprine (FLEXERIL) 10 MG tablet Take 1 tablet (10 mg total) by mouth 3 (three) times daily. Patient not taking: Reported on 12/09/2019 02/05/16   Lily Kocher, PA-C  dexamethasone (DECADRON) 4 MG tablet Take 1 tablet (4 mg total) by mouth 2 (two) times daily with a meal. Patient not taking: Reported on 04/17/2018 02/05/16   Lily Kocher, PA-C  levocetirizine (XYZAL) 5 MG tablet Take 1 tablet (5 mg total) by mouth every evening. Patient not taking: Reported on 12/09/2019 04/17/18   Scot Jun, FNP  meloxicam (MOBIC) 15 MG tablet Take 1 tablet (15 mg total) by mouth daily. Patient not taking: Reported on 04/17/2018 02/05/16   Lily Kocher, PA-C  montelukast (SINGULAIR) 10 MG tablet Take 1 tablet (10 mg total) by mouth at bedtime. Patient not taking: Reported on 12/09/2019 04/19/18   Shella Maxim, NP  phenazopyridine (PYRIDIUM) 200 MG tablet Take 1 tablet (200 mg total) by mouth 3 (three) times daily as needed for  pain. Patient not taking: Reported on 04/17/2018 04/20/17   Cresenzo-Dishmon, Scarlette Calico, CNM  predniSONE (DELTASONE) 20 MG tablet 2 tabs po daily x 4 days 12/09/19   Rolan Bucco, MD  sulfamethoxazole-trimethoprim (BACTRIM DS,SEPTRA DS) 800-160 MG tablet Take 1 tablet by mouth 2 (two) times daily. Patient not taking: Reported on 04/17/2018 10/14/16   Jacklyn Shell, CNM    Allergies    Latex, Ciprofloxacin hcl, Codeine, and Clarithromycin  Review of Systems   Review of Systems  Constitutional: Positive for fatigue and fever. Negative for chills and diaphoresis.  HENT: Positive for  congestion and rhinorrhea. Negative for sneezing.   Eyes: Negative.   Respiratory: Positive for cough, chest tightness and shortness of breath.   Cardiovascular: Negative for chest pain and leg swelling.  Gastrointestinal: Negative for abdominal pain, blood in stool, diarrhea, nausea and vomiting.  Genitourinary: Negative for difficulty urinating, flank pain, frequency and hematuria.  Musculoskeletal: Positive for myalgias. Negative for arthralgias and back pain.  Skin: Negative for rash.  Neurological: Positive for headaches. Negative for dizziness, speech difficulty, weakness and numbness.    Physical Exam Updated Vital Signs BP 133/86   Pulse 79   Temp 99 F (37.2 C) (Oral)   Resp 13   Ht 5\' 5"  (1.651 m)   Wt 81.6 kg   SpO2 99%   BMI 29.95 kg/m   Physical Exam Constitutional:      Appearance: She is well-developed.  HENT:     Head: Normocephalic and atraumatic.  Eyes:     Pupils: Pupils are equal, round, and reactive to light.  Cardiovascular:     Rate and Rhythm: Normal rate and regular rhythm.     Heart sounds: Normal heart sounds.  Pulmonary:     Effort: Pulmonary effort is normal. No respiratory distress.     Breath sounds: Normal breath sounds. No wheezing or rales.     Comments: Patient has tachypnea but no wheezing noted on exam.  No rhonchi or rails Chest:     Chest wall: No tenderness.  Abdominal:     General: Bowel sounds are normal.     Palpations: Abdomen is soft.     Tenderness: There is no abdominal tenderness. There is no guarding or rebound.  Musculoskeletal:        General: Normal range of motion.     Cervical back: Normal range of motion and neck supple.     Comments: No edema or calf tenderness  Lymphadenopathy:     Cervical: No cervical adenopathy.  Skin:    General: Skin is warm and dry.     Findings: No rash.  Neurological:     Mental Status: She is alert and oriented to person, place, and time.     ED Results / Procedures /  Treatments   Labs (all labs ordered are listed, but only abnormal results are displayed) Labs Reviewed  BASIC METABOLIC PANEL - Abnormal; Notable for the following components:      Result Value   Glucose, Bld 101 (*)    All other components within normal limits  RESPIRATORY PANEL BY RT PCR (FLU A&B, COVID)  CBC WITH DIFFERENTIAL/PLATELET  D-DIMER, QUANTITATIVE (NOT AT [facility])    EKG EKG Interpretation  Date/Time:  Saturday December 09 2019 20:07:15 EST Ventricular Rate:  90 PR Interval:    QRS Duration: 83 QT Interval:  336 QTC Calculation: 412 R Axis:   61 Text Interpretation: Sinus rhythm Nonspecific T abnormalities, diffuse leads Baseline wander in lead(s) II No  old tracing to compare Confirmed by Rolan Bucco [phone]) on 12/09/2019 8:30:58 PM   Radiology DG Chest 2 View  Result Date: 12/09/2019 CLINICAL DATA:  Or shortness of breath EXAM: CHEST - 2 VIEW COMPARISON:  None FINDINGS: Normal heart size, mediastinal contours, and pulmonary vascularity. Minimal peribronchial thickening. No infiltrate, pleural effusion or pneumothorax. Jewelry artifacts project over the lower lungs bilaterally. No acute osseous findings. IMPRESSION: Minimal bronchitic changes without infiltrate. Electronically Signed   By: Ulyses Southward M.D.   On: 12/09/2019 18:09    Procedures Procedures (including critical care time)  Medications Ordered in ED Medications  predniSONE (DELTASONE) tablet 60 mg (60 mg Oral Given 12/09/19 2228)    ED Course  I have reviewed the triage vital signs and the nursing notes.  Pertinent labs & imaging results that were available during my care of the patient were reviewed by me and considered in my medical decision making (see chart for details).    MDM Rules/Calculators/A&P                      Patient is a 24 year old female who presents with shortness of breath.  She has intermittent fevers and myalgias as well.  She had a negative Covid test yesterday.  I did do  the respiratory panel which was negative for Covid and the flu.  Her chest x-ray is clear without evidence of pneumonia.  There are some mild bronchitic changes.  She has been using her inhaler more frequently although currently I do not appreciate any wheezing on exam.  She continues to feel short of breath throughout the ED course.  She was given a dose of prednisone.  Her oxygen saturations remained normal in the high 90s on room air but she did have some intermittent tachypnea and intermittent tachycardia.  Given this I have a discussion with her about going ahead and doing a CT scan to rule out an occult PE.  She initially had opted to go ahead with this but then decided that she did not want to have any further testing done and was ready to go home.  I did discuss with her the limitations of the D-dimer and that its not always 100% accurate.  She is wanting to go home and will try a course of prednisone.  I encouraged her to use her inhaler a little bit more regularly and to follow-up with her primary care doctor on Monday if her symptoms are not improving.  Return precautions were given. Final Clinical Impression(s) / ED Diagnoses Final diagnoses:  Shortness of breath  Bronchitis    Rx / DC Orders ED Discharge Orders         Ordered    predniSONE (DELTASONE) 20 MG tablet     12/09/19 2312           Rolan Bucco, MD 12/09/19 2316

## 2020-03-28 ENCOUNTER — Ambulatory Visit: Payer: Managed Care, Other (non HMO) | Attending: Internal Medicine

## 2020-03-28 ENCOUNTER — Ambulatory Visit: Payer: Self-pay

## 2020-03-28 ENCOUNTER — Other Ambulatory Visit: Payer: Self-pay

## 2020-03-28 DIAGNOSIS — Z20822 Contact with and (suspected) exposure to covid-19: Secondary | ICD-10-CM

## 2020-03-29 LAB — SARS-COV-2, NAA 2 DAY TAT

## 2020-03-29 LAB — NOVEL CORONAVIRUS, NAA: SARS-CoV-2, NAA: NOT DETECTED

## 2020-07-23 ENCOUNTER — Other Ambulatory Visit: Payer: Self-pay | Admitting: Critical Care Medicine

## 2020-07-23 ENCOUNTER — Other Ambulatory Visit: Payer: Self-pay

## 2020-07-23 DIAGNOSIS — Z20822 Contact with and (suspected) exposure to covid-19: Secondary | ICD-10-CM

## 2020-07-25 LAB — SARS-COV-2, NAA 2 DAY TAT

## 2020-07-25 LAB — NOVEL CORONAVIRUS, NAA: SARS-CoV-2, NAA: NOT DETECTED

## 2020-07-30 ENCOUNTER — Other Ambulatory Visit: Payer: Self-pay

## 2020-11-20 ENCOUNTER — Other Ambulatory Visit: Payer: Managed Care, Other (non HMO)

## 2020-11-20 DIAGNOSIS — Z20822 Contact with and (suspected) exposure to covid-19: Secondary | ICD-10-CM

## 2020-11-22 LAB — SARS-COV-2, NAA 2 DAY TAT

## 2020-11-22 LAB — NOVEL CORONAVIRUS, NAA: SARS-CoV-2, NAA: NOT DETECTED

## 2021-01-06 ENCOUNTER — Ambulatory Visit: Admit: 2021-01-06 | Discharge status: Absent without leave | Payer: Managed Care, Other (non HMO)

## 2021-01-06 ENCOUNTER — Ambulatory Visit
Admission: RE | Admit: 2021-01-06 | Discharge: 2021-01-06 | Disposition: A | Discharge status: Home / Self Care | Payer: Managed Care, Other (non HMO) | Source: Ambulatory Visit

## 2021-01-06 ENCOUNTER — Other Ambulatory Visit: Payer: Self-pay

## 2021-01-06 VITALS — BP 124/79 | HR 110 | Temp 98.5°F | Resp 18

## 2021-01-06 DIAGNOSIS — Z1152 Encounter for screening for COVID-19: Secondary | ICD-10-CM

## 2021-01-06 DIAGNOSIS — J020 Streptococcal pharyngitis: Secondary | ICD-10-CM

## 2021-01-06 MED ORDER — CEFTRIAXONE SODIUM 1 G IJ SOLR
1.0000 g | Freq: Once | INTRAMUSCULAR | Status: AC
  Administered 2021-01-06: 1 g via INTRAMUSCULAR

## 2021-01-06 MED ORDER — LIDOCAINE VISCOUS HCL 2 % MT SOLN: 15.0000 mL | Freq: Four times a day (QID) | OROMUCOSAL | 1 refills | Status: DC | PRN

## 2021-01-06 NOTE — ED Provider Notes (Signed)
[facility]   [id number] 01/06/21 Arrival Time: 4098  JX:BJYN THROAT  SUBJECTIVE: History from: patient.  Kari Reilly is a 25 y.o. female who presented to the urgent care for complaint of positive strep pharyngitis for the past 2 to 3 days. Precipitating event.  Was prescribed penicillin at [facility].  Reports symptom has not improved as she has completed 2.5 days of penicillin tablet.  Symptoms are made worse with swallowing, but tolerating liquids and own secretions without difficulty.  Report previous symptoms in the past.   Denies fever, chills, fatigue, ear pain, sinus pain, rhinorrhea, nasal congestion, cough, SOB, wheezing, chest pain, nausea, rash, changes in bowel or bladder habits.    ROS: As per HPI.  All other pertinent ROS negative.     Past Medical History:  Diagnosis Date  . Asthma   . PCOS (polycystic ovarian syndrome)    Past Surgical History:  Procedure Laterality Date  . ADENOIDECTOMY    . TYMPANOSTOMY TUBE PLACEMENT     Allergies  Allergen Reactions  . Latex Rash  . Ciprofloxacin Hcl Hives  . Codeine Other (See Comments)    "acts crazy"  . Clarithromycin Rash   No current facility-administered medications on file prior to encounter.   Current Outpatient Medications on File Prior to Encounter  Medication Sig Dispense Refill  . lamoTRIgine (LAMICTAL) 200 MG tablet Take 200 mg by mouth daily.    Marland Kitchen lurasidone (LATUDA) 80 MG TABS tablet Take 80 mg by mouth daily with breakfast.    . Albuterol Sulfate (PROAIR RESPICLICK) 108 (90 Base) MCG/ACT AEPB Inhale 2 puffs into the lungs every 6 (six) hours as needed. (Patient taking differently: Inhale 2 puffs into the lungs every 6 (six) hours as needed (wheezing/shortness of breath). ) 1 each 0  . amoxicillin-clavulanate (AUGMENTIN) 875-125 MG tablet Take 1 tablet by mouth 2 (two) times daily. (Patient not taking: No sig reported) 20 tablet 0  . benzonatate (TESSALON) 100 MG capsule Take 1-2  capsules (100-200 mg total) by mouth 3 (three) times daily as needed for cough. (Patient not taking: No sig reported) 40 capsule 0  . cyclobenzaprine (FLEXERIL) 10 MG tablet Take 1 tablet (10 mg total) by mouth 3 (three) times daily. (Patient not taking: No sig reported) 20 tablet 0  . dexamethasone (DECADRON) 4 MG tablet Take 1 tablet (4 mg total) by mouth 2 (two) times daily with a meal. (Patient not taking: No sig reported) 12 tablet 0  . levocetirizine (XYZAL) 5 MG tablet Take 1 tablet (5 mg total) by mouth every evening. (Patient not taking: No sig reported) 30 tablet 0  . meloxicam (MOBIC) 15 MG tablet Take 1 tablet (15 mg total) by mouth daily. (Patient not taking: No sig reported) 7 tablet 0  . montelukast (SINGULAIR) 10 MG tablet Take 1 tablet (10 mg total) by mouth at bedtime. (Patient not taking: No sig reported) 14 tablet 0  . phenazopyridine (PYRIDIUM) 200 MG tablet Take 1 tablet (200 mg total) by mouth 3 (three) times daily as needed for pain. (Patient not taking: No sig reported) 10 tablet 3  . predniSONE (DELTASONE) 20 MG tablet 2 tabs po daily x 4 days 8 tablet 0  . sulfamethoxazole-trimethoprim (BACTRIM DS,SEPTRA DS) 800-160 MG tablet Take 1 tablet by mouth 2 (two) times daily. (Patient not taking: No sig reported) 10 tablet 0   Social History   Socioeconomic History  . Marital status: Significant Other    Spouse name: Not on file  .  Number of children: Not on file  . Years of education: Not on file  . Highest education level: Not on file  Occupational History  . Not on file  Tobacco Use  . Smoking status: Never Smoker  . Smokeless tobacco: Never Used  Substance and Sexual Activity  . Alcohol use: No  . Drug use: No  . Sexual activity: Not on file  Other Topics Concern  . Not on file  Social History Narrative  . Not on file   Social Determinants of Health   Financial Resource Strain: Not on file  Food Insecurity: Not on file  Transportation Needs: Not on file   Physical Activity: Not on file  Stress: Not on file  Social Connections: Not on file  Intimate Partner Violence: Not on file   History reviewed. No pertinent family history.  OBJECTIVE:  Vitals:   01/06/21 0924  BP: 124/79  Pulse: (!) 110  Resp: 18  Temp: 98.5 F (36.9 C)  TempSrc: Oral  SpO2: 97%     General appearance: alert; appears fatigued, but nontoxic, speaking in full sentences and managing own secretions HEENT: NCAT; Ears: EACs clear, TMs pearly gray with visible cone of light, without erythema; Eyes: PERRL, EOMI grossly; Nose: no obvious rhinorrhea; Throat: oropharynx clear, tonsils 2+ and mildly erythematous with white tonsillar exudates, uvula midline Neck: supple without LAD Lungs: CTA bilaterally without adventitious breath sounds; cough absent Heart: regular rate and rhythm.  Radial pulses 2+ symmetrical bilaterally Skin: warm and dry Psychological: alert and cooperative; normal mood and affect  LABS: No results found for this or any previous visit (from the past 24 hour(s)).   ASSESSMENT & PLAN:  1. Strep pharyngitis   2. Encounter for screening for COVID-19     Meds ordered this encounter  Medications  . cefTRIAXone (ROCEPHIN) injection 1 g  . lidocaine (XYLOCAINE) 2 % solution    Sig: Use as directed 15 mLs in the mouth or throat every 6 (six) hours as needed for mouth pain.    Dispense:  100 mL    Refill:  1   Discharge instructions  COVID-19 testing ordered.  It will take 2 to 7 days for results to return.  Someone will call if your result is abnormal.  Push fluids and get rest Continue penicillin as directed and to completion.  Rocephin IM was given in office Viscous lidocaine prescribed.  This is an oral solution you can swish, and gargle as needed for symptomatic relief of sore throat.  Do not exceed 8 doses in a 24 hour period.  Do not use prior to eating, as this will numb your entire mouth.   Drink warm or cool liquids, use throat  lozenges, or popsicles to help alleviate symptoms Drink warm or cool liquids, use throat lozenges, or popsicles to help alleviate symptoms Take OTC ibuprofen or tylenol as needed for pain Follow up with PCP if symptoms persist Return or go to ER if you have any new or worsening symptoms such as fever, chills, nausea, vomiting, worsening sore throat, cough, abdominal pain, chest pain, changes in bowel or bladder habits, etc...  Reviewed expectations re: course of current medical issues. Questions answered. Outlined signs and symptoms indicating need for more acute intervention. Patient verbalized understanding. After Visit Summary given.         Durward Parcel, FNP 01/06/21 [phone]

## 2021-01-06 NOTE — ED Triage Notes (Signed)
Positive for Strep throat on Saturday.  States throat is getting worse  Was placed on penicillin 500 mg.

## 2021-01-06 NOTE — Discharge Instructions (Addendum)
COVID-19 testing ordered.  It will take 2 to 7 days for results to return.  Someone will call if your result is abnormal.  Push fluids and get rest Continue penicillin as directed and to completion.  Rocephin IM was given in office Viscous lidocaine prescribed.  This is an oral solution you can swish, and gargle as needed for symptomatic relief of sore throat.  Do not exceed 8 doses in a 24 hour period.  Do not use prior to eating, as this will numb your entire mouth.   Drink warm or cool liquids, use throat lozenges, or popsicles to help alleviate symptoms Drink warm or cool liquids, use throat lozenges, or popsicles to help alleviate symptoms Take OTC ibuprofen or tylenol as needed for pain Follow up with PCP if symptoms persist Return or go to ER if you have any new or worsening symptoms such as fever, chills, nausea, vomiting, worsening sore throat, cough, abdominal pain, chest pain, changes in bowel or bladder habits, etc..Marland Kitchen

## 2021-01-08 LAB — NOVEL CORONAVIRUS, NAA: SARS-CoV-2, NAA: NOT DETECTED

## 2021-01-08 LAB — SARS-COV-2, NAA 2 DAY TAT

## 2021-06-25 ENCOUNTER — Other Ambulatory Visit: Payer: Self-pay

## 2021-06-25 ENCOUNTER — Ambulatory Visit (INDEPENDENT_AMBULATORY_CARE_PROVIDER_SITE_OTHER): Payer: BC Managed Care – PPO

## 2021-06-25 ENCOUNTER — Ambulatory Visit
Admission: EM | Admit: 2021-06-25 | Discharge: 2021-06-25 | Disposition: A | Discharge status: Home / Self Care | Payer: BC Managed Care – PPO

## 2021-06-25 DIAGNOSIS — W19XXXA Unspecified fall, initial encounter: Secondary | ICD-10-CM | POA: Diagnosis not present

## 2021-06-25 DIAGNOSIS — S93401A Sprain of unspecified ligament of right ankle, initial encounter: Secondary | ICD-10-CM | POA: Diagnosis not present

## 2021-06-25 DIAGNOSIS — M25571 Pain in right ankle and joints of right foot: Secondary | ICD-10-CM | POA: Diagnosis not present

## 2021-06-25 DIAGNOSIS — M25471 Effusion, right ankle: Secondary | ICD-10-CM

## 2021-06-25 DIAGNOSIS — M79604 Pain in right leg: Secondary | ICD-10-CM | POA: Diagnosis not present

## 2021-06-25 MED ORDER — NAPROXEN 500 MG PO TABS: 500.0000 mg | ORAL_TABLET | Freq: Two times a day (BID) | ORAL | 0 refills | Status: DC

## 2021-06-25 NOTE — ED Provider Notes (Signed)
[facility]   MRN: [id number] DOB: Nov 03, 1996  Subjective:   Kari Reilly is a 25 y.o. female presenting for suffering a fall at home walking down the stairs.  Patient rolled her right ankle and has since had severe pain of the ankle that is also felt pain radiating from the knee down into her foot.  Has had some swelling of the right ankle.  Has a history of right ankle injuries.  Has not taken anything for the pain, states that its not bad enough for her to take any oral medications for it.  No current facility-administered medications for this encounter.  Current Outpatient Medications:    QUEtiapine (SEROQUEL) 100 MG tablet, Take 100 mg by mouth at bedtime., Disp: , Rfl:    Albuterol Sulfate (PROAIR RESPICLICK) 108 (90 Base) MCG/ACT AEPB, Inhale 2 puffs into the lungs every 6 (six) hours as needed. (Patient taking differently: Inhale 2 puffs into the lungs every 6 (six) hours as needed (wheezing/shortness of breath). ), Disp: 1 each, Rfl: 0   amoxicillin-clavulanate (AUGMENTIN) 875-125 MG tablet, Take 1 tablet by mouth 2 (two) times daily. (Patient not taking: No sig reported), Disp: 20 tablet, Rfl: 0   benzonatate (TESSALON) 100 MG capsule, Take 1-2 capsules (100-200 mg total) by mouth 3 (three) times daily as needed for cough. (Patient not taking: No sig reported), Disp: 40 capsule, Rfl: 0   cyclobenzaprine (FLEXERIL) 10 MG tablet, Take 1 tablet (10 mg total) by mouth 3 (three) times daily. (Patient not taking: No sig reported), Disp: 20 tablet, Rfl: 0   dexamethasone (DECADRON) 4 MG tablet, Take 1 tablet (4 mg total) by mouth 2 (two) times daily with a meal. (Patient not taking: No sig reported), Disp: 12 tablet, Rfl: 0   lamoTRIgine (LAMICTAL) 200 MG tablet, Take 200 mg by mouth daily., Disp: , Rfl:    levocetirizine (XYZAL) 5 MG tablet, Take 1 tablet (5 mg total) by mouth every evening. (Patient not taking: No sig reported), Disp: 30 tablet, Rfl: 0   lidocaine  (XYLOCAINE) 2 % solution, Use as directed 15 mLs in the mouth or throat every 6 (six) hours as needed for mouth pain., Disp: 100 mL, Rfl: 1   lurasidone (LATUDA) 80 MG TABS tablet, Take 80 mg by mouth daily with breakfast., Disp: , Rfl:    meloxicam (MOBIC) 15 MG tablet, Take 1 tablet (15 mg total) by mouth daily. (Patient not taking: No sig reported), Disp: 7 tablet, Rfl: 0   montelukast (SINGULAIR) 10 MG tablet, Take 1 tablet (10 mg total) by mouth at bedtime. (Patient not taking: No sig reported), Disp: 14 tablet, Rfl: 0   phenazopyridine (PYRIDIUM) 200 MG tablet, Take 1 tablet (200 mg total) by mouth 3 (three) times daily as needed for pain. (Patient not taking: No sig reported), Disp: 10 tablet, Rfl: 3   predniSONE (DELTASONE) 20 MG tablet, 2 tabs po daily x 4 days, Disp: 8 tablet, Rfl: 0   sulfamethoxazole-trimethoprim (BACTRIM DS,SEPTRA DS) 800-160 MG tablet, Take 1 tablet by mouth 2 (two) times daily. (Patient not taking: No sig reported), Disp: 10 tablet, Rfl: 0   Allergies  Allergen Reactions   Latex Rash   Ciprofloxacin Hcl Hives   Codeine Other (See Comments)    "acts crazy"   Clarithromycin Rash    Past Medical History:  Diagnosis Date   Asthma    PCOS (polycystic ovarian syndrome)      Past Surgical History:  Procedure Laterality Date   ADENOIDECTOMY  TYMPANOSTOMY TUBE PLACEMENT      History reviewed. No pertinent family history.  Social History   Tobacco Use   Smoking status: Never   Smokeless tobacco: Never  Substance Use Topics   Alcohol use: Yes    Comment: occasional   Drug use: No    ROS   Objective:   Vitals: BP 118/72 (BP Location: Left Arm)   Pulse 93   Temp 98.3 F (36.8 C) (Oral)   Resp 18   LMP 04/16/2021 (Approximate)   SpO2 98%   Physical Exam Constitutional:      General: She is not in acute distress.    Appearance: Normal appearance. She is well-developed. She is obese. She is not ill-appearing, toxic-appearing or  diaphoretic.  HENT:     Head: Normocephalic and atraumatic.     Right Ear: External ear normal.     Left Ear: External ear normal.     Nose: Nose normal.     Mouth/Throat:     Mouth: Mucous membranes are moist.     Pharynx: Oropharynx is clear.  Eyes:     General: No scleral icterus.       Right eye: No discharge.        Left eye: No discharge.     Extraocular Movements: Extraocular movements intact.     Conjunctiva/sclera: Conjunctivae normal.     Pupils: Pupils are equal, round, and reactive to light.  Cardiovascular:     Rate and Rhythm: Normal rate.  Pulmonary:     Effort: Pulmonary effort is normal.  Musculoskeletal:     Right knee: No swelling, deformity, effusion, erythema, ecchymosis, lacerations, bony tenderness or crepitus. Normal range of motion. No tenderness. Normal alignment and normal patellar mobility.     Right lower leg: No swelling, deformity, lacerations, tenderness or bony tenderness. No edema.     Right ankle: Swelling (Over lateral malleolus extending posteriorly) present. No deformity, ecchymosis or lacerations. Tenderness present over the lateral malleolus, ATF ligament and AITF ligament. No medial malleolus, CF ligament, posterior TF ligament, base of 5th metatarsal or proximal fibula tenderness. Decreased range of motion.     Right Achilles Tendon: No tenderness or defects. Thompson's test negative.     Right foot: Normal range of motion and normal capillary refill. No swelling, deformity, laceration, tenderness, bony tenderness or crepitus.  Skin:    General: Skin is warm and dry.  Neurological:     General: No focal deficit present.     Mental Status: She is alert and oriented to person, place, and time.     Motor: No weakness.     Coordination: Coordination normal.     Gait: Gait normal.     Deep Tendon Reflexes: Reflexes normal.  Psychiatric:        Mood and Affect: Mood normal.        Behavior: Behavior normal.        Thought Content: Thought  content normal.        Judgment: Judgment normal.    DG Ankle Complete Right  Result Date: 06/25/2021 CLINICAL DATA:  Right ankle pain after fall today. EXAM: RIGHT ANKLE - COMPLETE 3+ VIEW COMPARISON:  None. FINDINGS: There is no evidence of fracture, dislocation, or joint effusion. There is no evidence of arthropathy or other focal bone abnormality. Soft tissues are unremarkable. IMPRESSION: Negative. Electronically Signed   By: Lupita Raider M.D.   On: 06/25/2021 12:30    Right ankle wrapped using 4" Ace wrap in  figure-8 method.   Assessment and Plan :   PDMP not reviewed this encounter.  1. Sprain of right ankle, unspecified ligament, initial encounter   2. Pain and swelling of right ankle   3. Right leg pain   4. Accidental fall, initial encounter     Will manage for ankle sprain with rice method, NSAID. Counseled patient on potential for adverse effects with medications prescribed/recommended today, ER and return-to-clinic precautions discussed, patient verbalized understanding.    Wallis Bamberg, [place] 06/25/21 1313

## 2021-06-25 NOTE — ED Triage Notes (Signed)
Pt c/o right ankle pain s/p fall at home on stairs this morning. 8/10 dull pain the radiates up to knee and down to toes. States has hx of hairline fractures to this ankle. No clear edema or discoloration. Pt states has iced x2hours at home.

## 2022-04-08 IMAGING — DX DG ANKLE COMPLETE 3+V*R*
3 series · 3 of 3 positions shown · non-contrast
Comparison: None.

CLINICAL DATA: Right ankle pain after fall today.

EXAM:
RIGHT ANKLE - COMPLETE 3+ VIEW

[ankle ap]
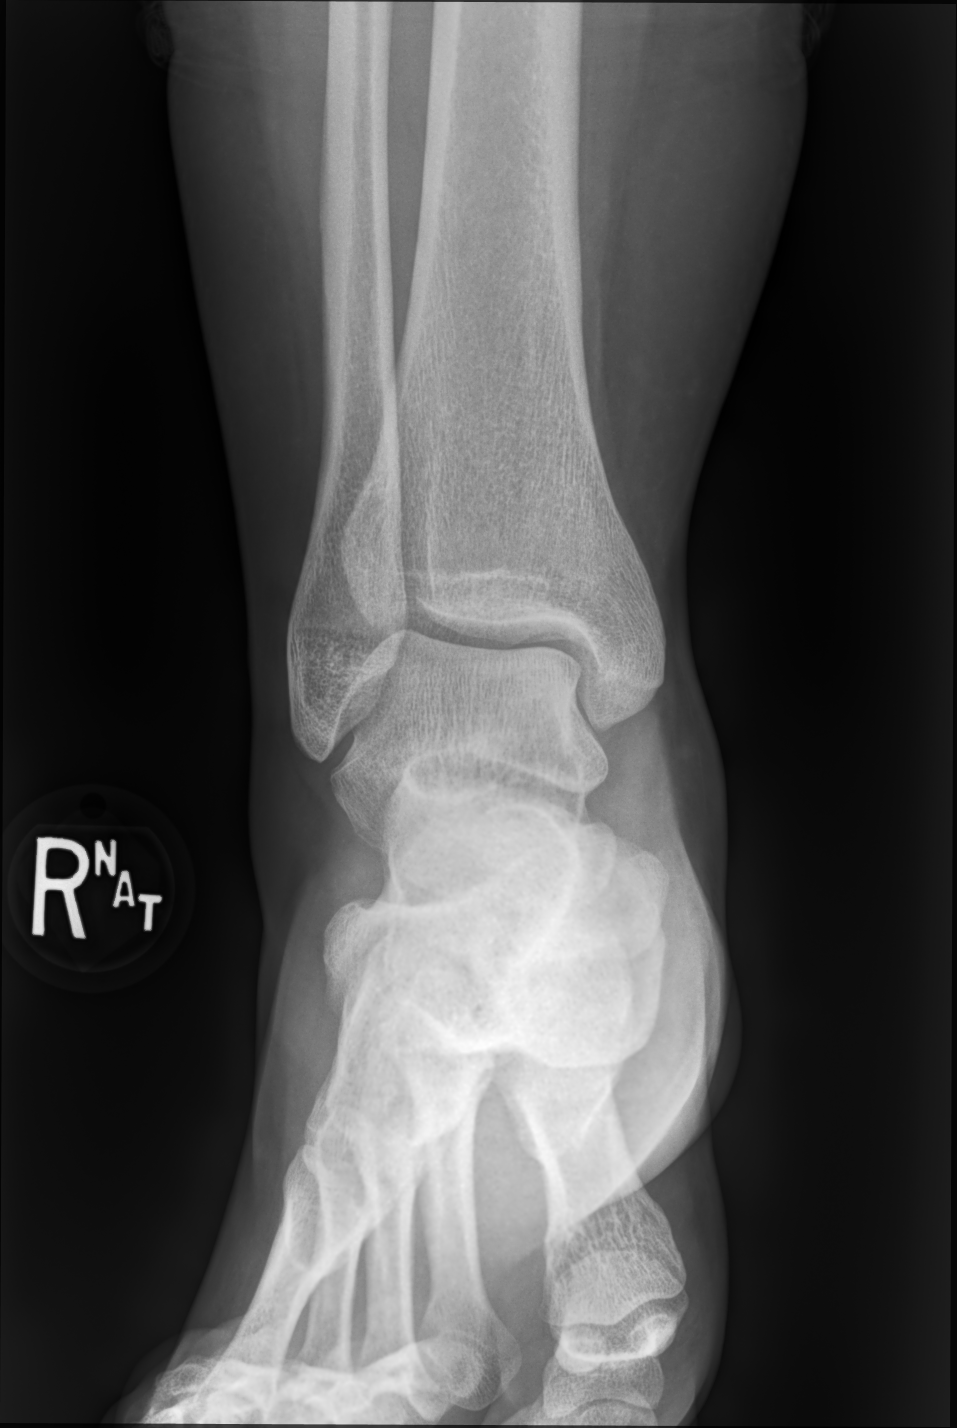

[ankle medial oblique]
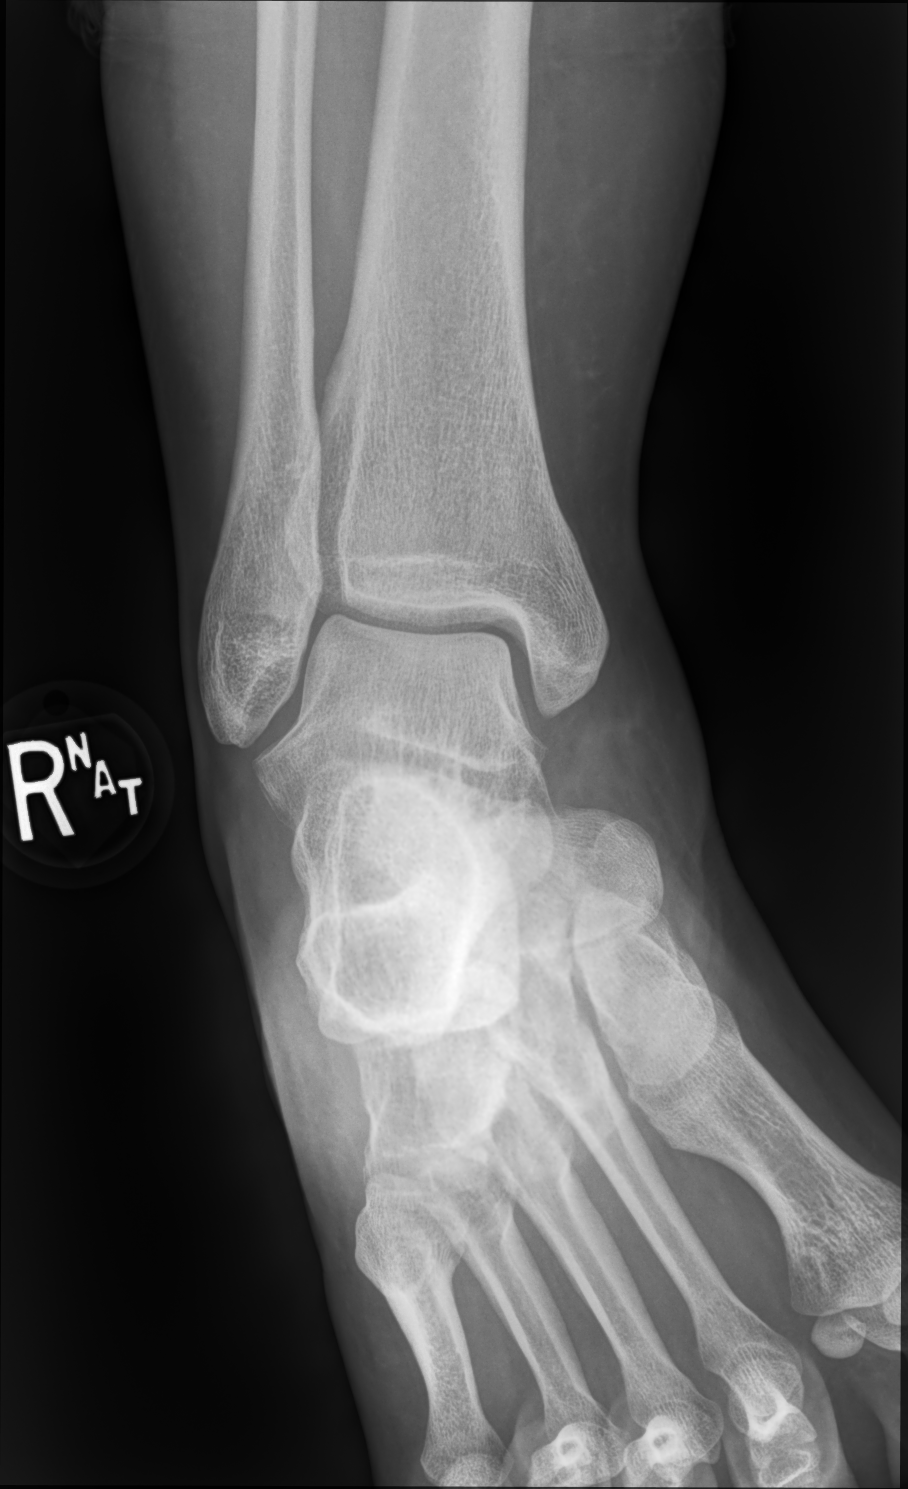

[ankle lat]
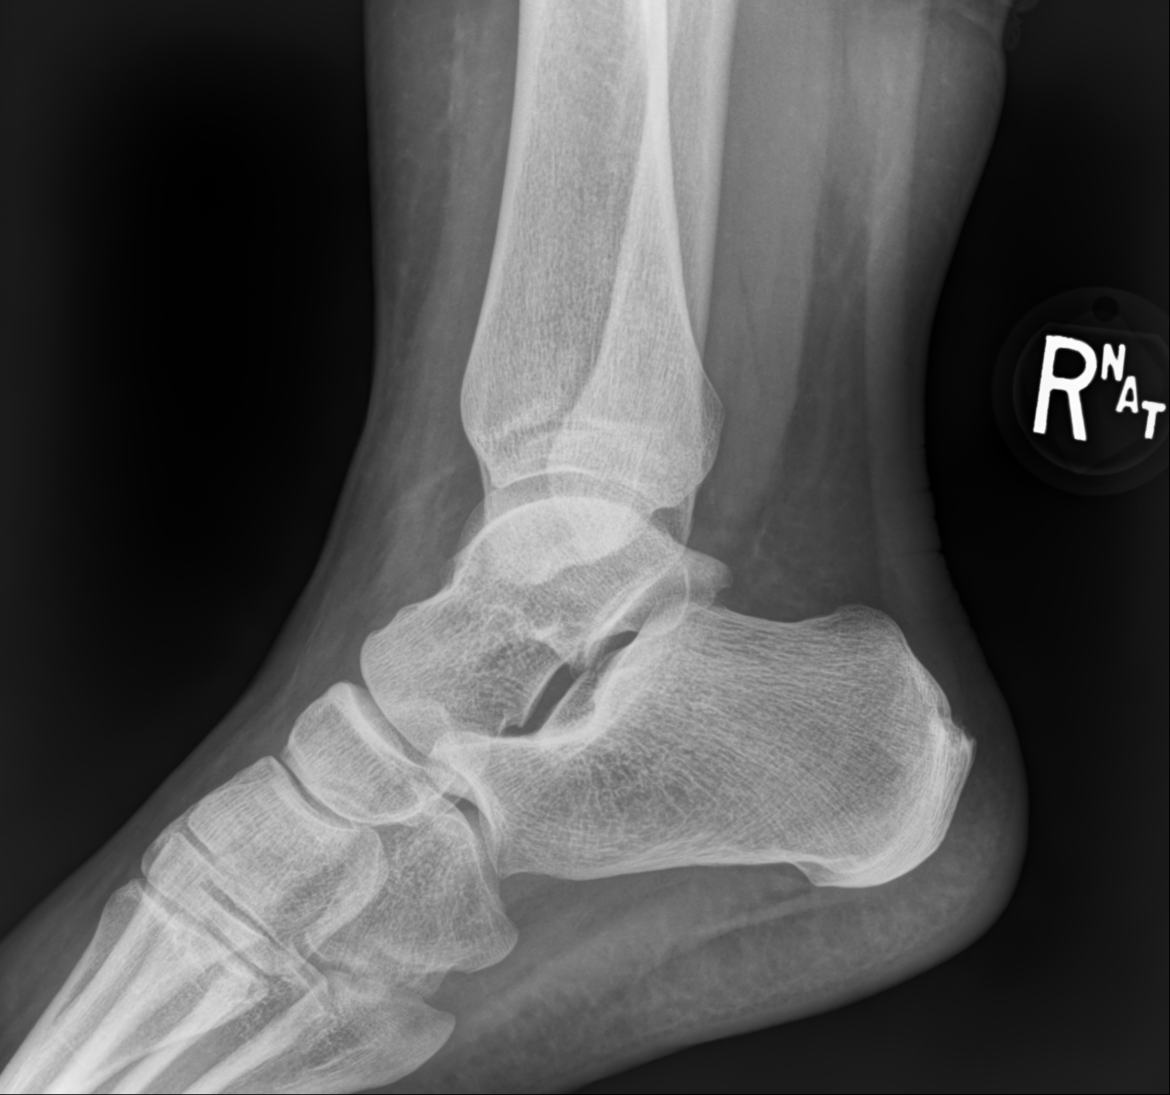

[3 of 3 positions shown; findings below may reference images not displayed]

FINDINGS: There is no evidence of fracture, dislocation, or joint effusion.
There is no evidence of arthropathy or other focal bone abnormality.
Soft tissues are unremarkable.
IMPRESSION: Negative.

## 2022-06-08 ENCOUNTER — Other Ambulatory Visit (HOSPITAL_COMMUNITY): Payer: Self-pay

## 2022-06-09 ENCOUNTER — Other Ambulatory Visit (HOSPITAL_COMMUNITY): Payer: Self-pay

## 2022-06-09 MED ORDER — SAXENDA 18 MG/3ML ~~LOC~~ SOPN
3.0000 mg | PEN_INJECTOR | Freq: Every day | SUBCUTANEOUS | 1 refills | Status: DC
  Filled 2022-06-09 – 2022-06-16 (×5): qty 15, 30d supply, fill #0

## 2022-06-11 ENCOUNTER — Other Ambulatory Visit (HOSPITAL_COMMUNITY): Payer: Self-pay

## 2022-06-11 ENCOUNTER — Ambulatory Visit: Payer: BC Managed Care – PPO

## 2022-06-12 ENCOUNTER — Ambulatory Visit
Admission: RE | Admit: 2022-06-12 | Discharge: 2022-06-12 | Disposition: A | Discharge status: Home / Self Care | Payer: Commercial Managed Care - PPO | Source: Ambulatory Visit | Attending: Emergency Medicine | Admitting: Emergency Medicine

## 2022-06-12 VITALS — BP 140/85 | HR 83 | Temp 98.1°F | Resp 16

## 2022-06-12 DIAGNOSIS — R350 Frequency of micturition: Secondary | ICD-10-CM | POA: Diagnosis present

## 2022-06-12 DIAGNOSIS — M545 Low back pain, unspecified: Secondary | ICD-10-CM

## 2022-06-12 LAB — POCT URINALYSIS DIP (MANUAL ENTRY)
Bilirubin, UA: NEGATIVE
Blood, UA: NEGATIVE
Glucose, UA: NEGATIVE mg/dL
Ketones, POC UA: NEGATIVE mg/dL
Leukocytes, UA: NEGATIVE
Nitrite, UA: NEGATIVE
Protein Ur, POC: NEGATIVE mg/dL
Spec Grav, UA: >=1.03 — AB (ref 1.010–1.025)
Urobilinogen, UA: 0.2 E.U./dL
pH, UA: 5.5 (ref 5.0–8.0)

## 2022-06-12 LAB — POCT FASTING CBG KUC MANUAL ENTRY: POCT Glucose (KUC): 81 mg/dL (ref 70–99)

## 2022-06-12 MED ORDER — SULFAMETHOXAZOLE-TRIMETHOPRIM 800-160 MG PO TABS: 1.0000 | ORAL_TABLET | Freq: Two times a day (BID) | ORAL | 0 refills | Status: AC

## 2022-06-12 MED ORDER — NAPROXEN 500 MG PO TABS: 500.0000 mg | ORAL_TABLET | Freq: Two times a day (BID) | ORAL | 0 refills | Status: DC

## 2022-06-12 NOTE — ED Triage Notes (Signed)
Pt c/o urinary frequency, urgency, abd and lower back cramping.  Started: 8 days ago   Home interventions: AZO, Macrobid (UTI, prescribed by GYN)

## 2022-06-12 NOTE — ED Provider Notes (Signed)
[facility]    CSN: [id number] Arrival date & time: 06/12/22  4431    HISTORY   Chief Complaint  Patient presents with   Urinary Frequency    Frequency and urgency with pain in lower abdomen and kidney areas. Already on Macrobid 100mg  Q12 for "supposed" UTI. - Entered by patient   HPI Kari Reilly is a pleasant, 26 y.o. female who presents to urgent care today. Pt c/o urinary frequency, urgency, abd and lower back cramping for the past 8 days.  Patient states she contacted her gynecologist who sent her prescription for Macrobid without seeing her.  Patient states she has been taking Azo as well.  Patient states that last night she had back pain that caused her to curl up into a fetal position.  Patient states she is not having burning when she urinates but prior to urination has a clogging sensation just around the area of her bladder.  Patient reports a history of PCOS, denies a history of T2DM however has noticed that she has been urinating much more frequently despite not drinking as much water as she usually does.  Patient states the Macrobid does provide her with no relief of her symptoms.  Patient states has not tried any other medications or remedies.  Patient has normal vital signs on arrival today, urinalysis is unremarkable.  The history is provided by the patient.   Past Medical History:  Diagnosis Date   Asthma    PCOS (polycystic ovarian syndrome)    There are no problems to display for this patient.  Past Surgical History:  Procedure Laterality Date   ADENOIDECTOMY     TYMPANOSTOMY TUBE PLACEMENT     OB History   No obstetric history on file.    Home Medications    Prior to Admission medications   Medication Sig Start Date End Date Taking? Authorizing Provider  Albuterol Sulfate (PROAIR RESPICLICK) 108 (90 Base) MCG/ACT AEPB Inhale 2 puffs into the lungs every 6 (six) hours as needed. Patient taking differently: Inhale 2 puffs into the lungs every  6 (six) hours as needed (wheezing/shortness of breath).  04/17/18   06/17/18, FNP  lamoTRIgine (LAMICTAL) 200 MG tablet Take 200 mg by mouth daily.    [provider]  Liraglutide -Weight Management (SAXENDA) 18 MG/3ML SOPN Inject 3 mg into the skin daily. 06/08/22     lurasidone (LATUDA) 80 MG TABS tablet Take 80 mg by mouth daily with breakfast.    [provider]  QUEtiapine (SEROQUEL) 100 MG tablet Take 100 mg by mouth at bedtime.    [provider]    Family History History reviewed. No pertinent family history. Social History Social History   Tobacco Use   Smoking status: Never   Smokeless tobacco: Never  Substance Use Topics   Alcohol use: Yes    Comment: occasional   Drug use: No   Allergies   Latex, Ciprofloxacin hcl, Codeine, and Clarithromycin  Review of Systems Review of Systems Pertinent findings revealed after performing a 14 point review of systems has been noted in the history of present illness.  Physical Exam Triage Vital Signs ED Triage Vitals  Enc Vitals Group     BP 09/12/21 0827 (!) 147/82     Pulse Rate 09/12/21 0827 72     Resp 09/12/21 0827 18     Temp 09/12/21 0827 98.3 F (36.8 C)     Temp Source 09/12/21 0827 Oral     SpO2  09/12/21 0827 98 %     Weight --      Height --      Head Circumference --      Peak Flow --      Pain Score 09/12/21 0826 5     Pain Loc --      Pain Edu? --      Excl. in [place]? --   No data found.  Updated Vital Signs BP 140/85 (BP Location: Right Arm)   Pulse 83   Temp 98.1 F (36.7 C) (Oral)   Resp 16   LMP 06/08/2022   SpO2 98%   Physical Exam Vitals and nursing note reviewed.  Constitutional:      General: She is not in acute distress.    Appearance: Normal appearance. She is not ill-appearing.  HENT:     Head: Normocephalic and atraumatic.  Eyes:     General: Lids are normal.        Right eye: No discharge.        Left eye: No discharge.     Extraocular Movements:  Extraocular movements intact.     Conjunctiva/sclera: Conjunctivae normal.     Right eye: Right conjunctiva is not injected.     Left eye: Left conjunctiva is not injected.  Neck:     Trachea: Trachea and phonation normal.  Cardiovascular:     Rate and Rhythm: Normal rate and regular rhythm.     Pulses: Normal pulses.     Heart sounds: Normal heart sounds. No murmur heard.    No friction rub. No gallop.  Pulmonary:     Effort: Pulmonary effort is normal. No accessory muscle usage, prolonged expiration or respiratory distress.     Breath sounds: Normal breath sounds. No stridor, decreased air movement or transmitted upper airway sounds. No decreased breath sounds, wheezing, rhonchi or rales.  Chest:     Chest wall: No tenderness.  Abdominal:     General: Abdomen is flat. Bowel sounds are normal. There is no distension.     Palpations: Abdomen is soft.     Tenderness: There is abdominal tenderness in the suprapubic area. There is no right CVA tenderness or left CVA tenderness.     Hernia: No hernia is present.  Musculoskeletal:        General: Normal range of motion.     Cervical back: Normal range of motion and neck supple. Normal range of motion.  Lymphadenopathy:     Cervical: No cervical adenopathy.  Skin:    General: Skin is warm and dry.     Findings: No erythema or rash.  Neurological:     General: No focal deficit present.     Mental Status: She is alert and oriented to person, place, and time.  Psychiatric:        Mood and Affect: Mood normal.        Behavior: Behavior normal.     Visual Acuity Right Eye Distance:   Left Eye Distance:   Bilateral Distance:    Right Eye Near:   Left Eye Near:    Bilateral Near:     UC Couse / Diagnostics / Procedures:     Radiology No results found.  Procedures Procedures (including critical care time) EKG  Pending results:  Labs Reviewed  POCT URINALYSIS DIP (MANUAL ENTRY) - Abnormal; Notable for the following  components:      Result Value   Spec Grav, UA >=1.030 (*)    All other components within  normal limits  URINE CULTURE  POCT FASTING CBG [facility] MANUAL ENTRY    Medications Ordered in UC: Medications - No data to display  UC Diagnoses / Final Clinical Impressions(s)   I have reviewed the triage vital signs and the nursing notes.  Pertinent labs & imaging results that were available during my care of the patient were reviewed by me and considered in my medical decision making (see chart for details).    Final diagnoses:  Increased frequency of urination  Acute bilateral low back pain without sciatica   Patient was advised that I recommend we treat her empirically for unresolved urinary tract infection while we await the results of her urine culture.  Patient was provided with a prescription for Bactrim.  Patient was also advised she would benefit from abdominal imaging, either TVUS, abdominal ultrasound or CT scan to rule out things such as colitis, enlarged variances, kidney stone.  Patient provided with the name of a gynecologist who could likely see her fairly soon.  Patient vies go to the emergency room if her pain becomes unbearable.  ED Prescriptions     Medication Sig Dispense Auth. Provider   naproxen (NAPROSYN) 500 MG tablet Take 1 tablet (500 mg total) by mouth 2 (two) times daily. 30 tablet Theadora Rama Scales, PA-C   sulfamethoxazole-trimethoprim (BACTRIM DS) 800-160 MG tablet Take 1 tablet by mouth 2 (two) times daily for 5 days. 10 tablet Theadora Rama Scales, PA-C      PDMP not reviewed this encounter.  Disposition Upon Discharge:  Condition: stable for discharge home  Patient presented with concern for an acute illness with associated systemic symptoms and significant discomfort requiring urgent management. In my opinion, this is a condition that a prudent lay person (someone who possesses an average knowledge of health and medicine) may potentially expect to result  in complications if not addressed urgently such as respiratory distress, impairment of bodily function or dysfunction of bodily organs.   As such, the patient has been evaluated and assessed, work-up was performed and treatment was provided in alignment with urgent care protocols and evidence based medicine.  Patient/parent/caregiver has been advised that the patient may require follow up for further testing and/or treatment if the symptoms continue in spite of treatment, as clinically indicated and appropriate.  Routine symptom specific, illness specific and/or disease specific instructions were discussed with the patient and/or caregiver at length.  Prevention strategies for avoiding STD exposure were also discussed.  The patient will follow up with their current PCP if and as advised. If the patient does not currently have a PCP we will assist them in obtaining one.   The patient may need specialty follow up if the symptoms continue, in spite of conservative treatment and management, for further workup, evaluation, consultation and treatment as clinically indicated and appropriate.  Patient/parent/caregiver verbalized understanding and agreement of plan as discussed.  All questions were addressed during visit.  Please see discharge instructions below for further details of plan.  Discharge Instructions:   Discharge Instructions      Your urinalysis today was unremarkable, urine culture will be completed we will notify you of the results once we receive them.  Initially the result goes to your MyChart account and if there is an abnormal finding, particularly if it requires adjustment of your antibiotics, you will be contacted by phone.  In the meantime, please begin taking Bactrim 1 tablet twice daily for presumed urinary tract infection that did not respond to Macrobid which you  can discontinue at this time.  Your blood sugar level was very normal today, so I have no concern for type 2  diabetes driving your increased frequency of urination.  Other things to consider as causes of your lower back pain would be a kidney stone, colitis or musculoskeletal pain.  The first 2 would best be evaluated with imaging, either abdominal ultrasound or CT scan.  The latter we will attempt to treat with naproxen 500 mg twice daily.  Thank you for visiting urgent care today.  I hope you feel better soon.      This office note has been dictated using Teaching laboratory technician.  Unfortunately, this method of dictation can sometimes lead to typographical or grammatical errors.  I apologize for your inconvenience in advance if this occurs.  Please do not hesitate to reach out to me if clarification is needed.       Theadora Rama Scales, [place] 06/12/22 [phone]

## 2022-06-12 NOTE — Discharge Instructions (Signed)
Your urinalysis today was unremarkable, urine culture will be completed we will notify you of the results once we receive them.  Initially the result goes to your MyChart account and if there is an abnormal finding, particularly if it requires adjustment of your antibiotics, you will be contacted by phone.  In the meantime, please begin taking Bactrim 1 tablet twice daily for presumed urinary tract infection that did not respond to Macrobid which you can discontinue at this time.  Your blood sugar level was very normal today, so I have no concern for type 2 diabetes driving your increased frequency of urination.  Other things to consider as causes of your lower back pain would be a kidney stone, colitis or musculoskeletal pain.  The first 2 would best be evaluated with imaging, either abdominal ultrasound or CT scan.  The latter we will attempt to treat with naproxen 500 mg twice daily.  Thank you for visiting urgent care today.  I hope you feel better soon.

## 2022-06-13 LAB — URINE CULTURE: Culture: NO GROWTH

## 2022-06-15 ENCOUNTER — Other Ambulatory Visit (HOSPITAL_COMMUNITY): Payer: Self-pay

## 2022-06-16 ENCOUNTER — Other Ambulatory Visit (HOSPITAL_COMMUNITY): Payer: Self-pay

## 2022-08-16 HISTORY — PX: LEEP: SHX91

## 2022-08-28 ENCOUNTER — Ambulatory Visit: Payer: Self-pay

## 2022-08-28 ENCOUNTER — Other Ambulatory Visit: Payer: Self-pay

## 2022-08-28 ENCOUNTER — Ambulatory Visit
Admission: RE | Admit: 2022-08-28 | Discharge: 2022-08-28 | Disposition: A | Discharge status: Home / Self Care | Payer: Commercial Managed Care - PPO | Source: Ambulatory Visit

## 2022-08-28 VITALS — BP 120/88 | HR 96 | Temp 97.6°F | Resp 18 | Ht 65.0 in | Wt 190.0 lb

## 2022-08-28 DIAGNOSIS — R112 Nausea with vomiting, unspecified: Secondary | ICD-10-CM

## 2022-08-28 DIAGNOSIS — E860 Dehydration: Secondary | ICD-10-CM

## 2022-08-28 LAB — POCT URINALYSIS DIP (MANUAL ENTRY)
Glucose, UA: NEGATIVE mg/dL
Leukocytes, UA: NEGATIVE
Nitrite, UA: NEGATIVE
Protein Ur, POC: NEGATIVE mg/dL
Spec Grav, UA: >=1.03 — AB (ref 1.010–1.025)
Urobilinogen, UA: 0.2 E.U./dL
pH, UA: 5.5 (ref 5.0–8.0)

## 2022-08-28 LAB — POCT URINE PREGNANCY: Preg Test, Ur: NEGATIVE

## 2022-08-28 MED ORDER — ONDANSETRON 4 MG PO TBDP
4.0000 mg | ORAL_TABLET | Freq: Once | ORAL | Status: AC
  Administered 2022-08-28: 4 mg via ORAL

## 2022-08-28 MED ORDER — PROMETHAZINE HCL 25 MG RE SUPP: 25.0000 mg | Freq: Four times a day (QID) | RECTAL | 0 refills | Status: DC | PRN

## 2022-08-28 MED ORDER — SODIUM CHLORIDE 0.9 % IV SOLN: Freq: Once | INTRAVENOUS | Status: AC

## 2022-08-28 NOTE — Discharge Instructions (Addendum)
You were given 8 mg of Zofran in our office, the last 4 mg dose was given at roughly 6 PM.    I have called in a promethazine rectal suppository. You can use this every 6 hours as needed for nausea and vomiting. You can take your first dose in 6 hour if needed (around midnight)  Please monitor for any development of abdominal pain, or fever.  If your vomiting persists despite medication, I would recommend an emergency room evaluation for lab work and possible imaging.

## 2022-08-28 NOTE — ED Provider Notes (Signed)
[facility]    CSN: [id number] Arrival date & time: 08/28/22  1523      History   Chief Complaint Chief Complaint  Patient presents with   Emesis    Entered by patient    HPI Kari Reilly is a 26 y.o. female.   26 year old female presents today due to 24-hour history of vomiting.  She states around noon yesterday, she started vomiting.  She has been unable to keep any food down since.  She has been trying to do small sips of water, but states she has been vomiting these up as well.  She had a LEEP procedure yesterday, but denies any pelvic pain or cramping.  She also denies any abdominal pain other than feeling sore from the vomiting.  She takes [facility], has taken this for the past month.  Increased the dose last week, has not missed a dose.  Was previously on another weight loss agent and only changed due to insurance requirements.  Has never had any adverse effects on this in the past.  Denies symptoms of pancreatitis.  She lost 100# last year, but reports 20# weight gain since June. She came in specifically requesting IV hydration.  She states she feels extremely dry and lightheaded.  Called her gynecologist who performed the LEEP procedure, who called in methocarbamol for suspected pelvic cramping and reportedly requested she be seen in an urgent care should the vomiting persist.  Patient denies any hematemesis.  She denies any diarrhea or any additional GI symptoms.      Emesis   Past Medical History:  Diagnosis Date   Asthma    PCOS (polycystic ovarian syndrome)     There are no problems to display for this patient.   Past Surgical History:  Procedure Laterality Date   ADENOIDECTOMY     TYMPANOSTOMY TUBE PLACEMENT      OB History   No obstetric history on file.      Home Medications    Prior to Admission medications   Medication Sig Start Date End Date Taking? Authorizing Provider  Semaglutide-Weight Management (WEGOVY) 1.7 MG/0.75ML SOAJ  Inject 1.7 mg into the skin.   Yes [provider]  Albuterol Sulfate (PROAIR RESPICLICK) 123XX123 (90 Base) MCG/ACT AEPB Inhale 2 puffs into the lungs every 6 (six) hours as needed. Patient taking differently: Inhale 2 puffs into the lungs every 6 (six) hours as needed (wheezing/shortness of breath).  04/17/18   Scot Jun, FNP  hydrOXYzine (ATARAX) 25 MG tablet Take 25 mg by mouth 2 (two) times daily as needed. 04/27/22   [provider]  promethazine (PHENERGAN) 25 MG suppository Place 1 suppository (25 mg total) rectally every 6 (six) hours as needed for nausea or vomiting. 08/28/22  Yes Kristia Jupiter L, PA  QUEtiapine (SEROQUEL) 100 MG tablet Take 100 mg by mouth at bedtime.    [provider]    Family History Family History  Problem Relation Age of Onset   Anemia Mother     Social History Social History   Tobacco Use   Smoking status: Never   Smokeless tobacco: Never  Vaping Use   Vaping Use: Never used  Substance Use Topics   Alcohol use: Yes    Comment: occasional   Drug use: No     Allergies   Latex, Ciprofloxacin hcl, Codeine, and Clarithromycin   Review of Systems Review of Systems  Gastrointestinal:  Positive for vomiting.  As per HPI   Physical Exam Triage  Vital Signs ED Triage Vitals  Enc Vitals Group     BP 08/28/22 1541 (!) 128/96     Pulse Rate 08/28/22 1541 (!) 117     Resp 08/28/22 1541 18     Temp 08/28/22 1541 98.9 F (37.2 C)     Temp Source 08/28/22 1541 Oral     SpO2 08/28/22 1541 97 %     Weight 08/28/22 1542 190 lb (86.2 kg)     Height 08/28/22 1542 5\' 5"  (1.651 m)     Head Circumference --      Peak Flow --      Pain Score 08/28/22 1541 0     Pain Loc --      Pain Edu? --      Excl. in [place]? --    No data found.  Updated Vital Signs BP 120/88 (BP Location: Left Arm)   Pulse 96   Temp 97.6 F (36.4 C) (Oral)   Resp 18   Ht 5\' 5"  (1.651 m)   Wt 190 lb (86.2 kg)   SpO2 97%   BMI 31.62 kg/m    Visual Acuity Right Eye Distance:   Left Eye Distance:   Bilateral Distance:    Right Eye Near:   Left Eye Near:    Bilateral Near:     Physical Exam Vitals and nursing note reviewed.  Constitutional:      General: She is not in acute distress.    Appearance: Normal appearance. She is obese. She is not ill-appearing, toxic-appearing or diaphoretic.  HENT:     Head: Normocephalic and atraumatic.     Nose: Nose normal.     Mouth/Throat:     Mouth: Mucous membranes are dry.     Pharynx: Oropharynx is clear. No oropharyngeal exudate or posterior oropharyngeal erythema.  Eyes:     General: No scleral icterus.       Right eye: No discharge.        Left eye: No discharge.     Extraocular Movements: Extraocular movements intact.     Conjunctiva/sclera: Conjunctivae normal.     Pupils: Pupils are equal, round, and reactive to light.  Cardiovascular:     Rate and Rhythm: Regular rhythm. Tachycardia present.     Pulses: Normal pulses.     Heart sounds: No murmur heard. Pulmonary:     Effort: Pulmonary effort is normal. No respiratory distress.     Breath sounds: Normal breath sounds. No stridor. No wheezing, rhonchi or rales.  Chest:     Chest wall: No tenderness.  Abdominal:     General: Abdomen is flat. Bowel sounds are normal. There is no distension.     Palpations: Abdomen is soft. There is no mass.     Tenderness: There is no abdominal tenderness. There is no right CVA tenderness, left CVA tenderness, guarding or rebound.     Hernia: No hernia is present.  Musculoskeletal:     Cervical back: Normal range of motion. No rigidity or tenderness.     Right lower leg: No edema.     Left lower leg: No edema.  Skin:    General: Skin is warm and dry.     Capillary Refill: Capillary refill takes less than 2 seconds.     Coloration: Skin is not jaundiced.     Findings: No bruising, erythema or rash.     Comments: Minimal skin tenting  Neurological:     General: No focal deficit  present.  Mental Status: She is alert and oriented to person, place, and time.     UC Treatments / Results  Labs (all labs ordered are listed, but only abnormal results are displayed) Labs Reviewed  POCT URINALYSIS DIP (MANUAL ENTRY) - Abnormal; Notable for the following components:      Result Value   Clarity, UA cloudy (*)    Bilirubin, UA small (*)    Ketones, POC UA small (15) (*)    Spec Grav, UA >=1.030 (*)    Blood, UA large (*)    All other components within normal limits  POCT URINE PREGNANCY    EKG   Radiology No results found.  Procedures Procedures (including critical care time)  Medications Ordered in UC Medications  ondansetron (ZOFRAN-ODT) disintegrating tablet 4 mg (4 mg Oral Given 08/28/22 1559)  0.9 %  sodium chloride infusion (0 mLs Intravenous Stopped 08/28/22 1745)  ondansetron (ZOFRAN-ODT) disintegrating tablet 4 mg (4 mg Oral Given 08/28/22 1754)    Initial Impression / Assessment and Plan / UC Course  I have reviewed the triage vital signs and the nursing notes.  Pertinent labs & imaging results that were available during my care of the patient were reviewed by me and considered in my medical decision making (see chart for details).     Nausea and vomiting -patient had no vomiting during her time in our clinic.  She was initially given 1 oral dissolvable Zofran around 4 PM.  Due to persistent symptoms of nausea, and additional Zofran was given at 6 PM.  Patient understands this is the max dose.  She continues to feel queasy.  I reexamined her after the bolus of fluids, still no point tenderness in the abdomen.  Negative Murphy sign.  I question this possibly being secondary to a virus. Pt's pulse rate dropped from 116 to 96 s/p fluids. Recheck temp was also normal. Will call in rectal promethazine for at home use. Continue small sips of water, BRAT diet. Start with small amounts of saltine crackers, chicken soup broth or dry toast. If vomiting  persists, you develop a fever, or develop abdominal pain, you must head to the ER for a further workup. Dehydration - urine sample showing ketones and > specific gravity secondary to dehydration. Pt did well with 1L fluids, improvement in vital signs noted. No vomiting since prior to arrival.    Final Clinical Impressions(s) / UC Diagnoses   Final diagnoses:  Nausea and vomiting, unspecified vomiting type  Dehydration     Discharge Instructions      You were given 8 mg of Zofran in our office, the last 4 mg dose was given at roughly 6 PM.    I have called in a promethazine rectal suppository. You can use this every 6 hours as needed for nausea and vomiting. You can take your first dose in 6 hour if needed (around midnight)  Please monitor for any development of abdominal pain, or fever.  If your vomiting persists despite medication, I would recommend an emergency room evaluation for lab work and possible imaging.     ED Prescriptions     Medication Sig Dispense Auth. Provider   promethazine (PHENERGAN) 25 MG suppository Place 1 suppository (25 mg total) rectally every 6 (six) hours as needed for nausea or vomiting. 12 each Marsden Zaino L, PA      PDMP not reviewed this encounter.   Chaney Malling, [place] 08/28/22 [phone]

## 2022-08-28 NOTE — ED Triage Notes (Signed)
LEEP procedure yesterday, they sent in a muscle relaxer refused Zofran advised her to go to Urgent Care.Wegovy injection after procedure.

## 2022-08-29 ENCOUNTER — Telehealth: Payer: Self-pay | Admitting: Emergency Medicine

## 2022-08-29 NOTE — Telephone Encounter (Signed)
Call to see how Kari Reilly was tosay - pt had one episode of emesis at 0200 but feels better now- pt did pick up prescription for  PR phenergan but " couldn't give it to myself". No other questions or concerns at this time. Pt thanked RN for care yesterday & for follow up call.

## 2022-11-24 ENCOUNTER — Telehealth: Payer: Self-pay | Admitting: Emergency Medicine

## 2022-11-24 ENCOUNTER — Ambulatory Visit: Payer: Self-pay

## 2022-11-24 NOTE — Telephone Encounter (Signed)
Call to [facility] regarding symptoms - pt had known exposure to flu. COVID test was negative at home. Pt given option  of making an online Urgent Care visit due to current weather advisory. Pt thanked Therapist, sports for the cal and stated she would try to do an online visit.

## 2022-11-25 ENCOUNTER — Ambulatory Visit: Payer: Self-pay

## 2023-04-16 ENCOUNTER — Encounter: Payer: Self-pay | Admitting: Internal Medicine

## 2023-04-16 NOTE — Telephone Encounter (Signed)
I spoke with the pt and she was asking that if she sees Dr Tenny Craw as a new pt 05/19/23 will she also be treating her migraines. She says that her current EP MD declined treating them and has asked her to see a Neurologist. She says that her most important problem despite her HTN and tachycardia is her headaches and does not want to "waste her time nor Dr Tenny Craw' time.   I advised her that I cannot promise that Dr Tenny Craw will not also recommend the same and if she is having headaches then she probably needs to see neuro, we also do not send in meds for migraines if not a cardiac cause... but we can help manage any underlying cardiac issues that she has if appropriate but until she is seen I am not sure of the treatment/ testing plan Dr Tenny Craw will need on her until she is assessed in person and she agreed and reports she would like to keep her appt for now.

## 2023-05-17 ENCOUNTER — Encounter: Payer: Self-pay | Admitting: Internal Medicine

## 2023-05-17 NOTE — Telephone Encounter (Signed)
Spoke with pt who states her mother was diagnosed with cancer last week and also is scheduled for an oncology appointment on 05/19/2023 at 11am.  She is not sure if she should cancel her new patient appointment with Dr Tenny Craw since she has been waiting so long but she feels she does need to go with her mother.  Offered pt next new pt appointment on 07/27/2023.  Pt states she would like to speak with Dewayne Hatch tomorrow when she is back in the office.  Pt advised will forward message.  Pt thanked Charity fundraiser for her assistance.

## 2023-05-17 NOTE — Telephone Encounter (Signed)
Spoke with the pt and made some changes so the pt can come in to see Dr Tenny Craw prior to her mom's appt at 8:00 am.

## 2023-05-19 ENCOUNTER — Ambulatory Visit: Payer: No Typology Code available for payment source | Attending: Internal Medicine | Admitting: Internal Medicine

## 2023-05-19 VITALS — BP 116/80 | HR 81 | Ht 64.0 in | Wt 182.2 lb

## 2023-05-19 DIAGNOSIS — R000 Tachycardia, unspecified: Secondary | ICD-10-CM | POA: Diagnosis not present

## 2023-05-19 NOTE — Patient Instructions (Signed)
Medication Instructions:  Your physician recommends that you continue on your current medications as directed. Please refer to the Current Medication list given to you today.  *If you need a refill on your cardiac medications before your next appointment, please call your pharmacy*

## 2023-05-19 NOTE — Progress Notes (Signed)
Cardiology Office Note   Date:  05/19/2023   ID:  Carsyn, Salm 1996-09-15, MRN [id number]  PCP:  Marvis Repress, MD  Cardiologist:   Dietrich Pates, MD   Pt presents for evaluation of dizziness History of Present Illness: Lam Stiles Garrette is a 27 y.o. female with a history of obesity, depression, bipolar d/o . In Aug 2022 she started using [facility] for weight loss. By March 2023 she had lost close to 80 lbs    In Feb 2023 she started having episodes of heart racing, dizziness  Worse in the shower   No syncope but close.  Occur any time during  day, unrelated to exertion.   Sometimes heart racing would wake her from sleep.  At high heart rates can develop chest pressure.  The pt also developed HA with the palpitations.  Bitemporal then across whole head  Nagging/dull then very severe  Headaches not affected by positon   The pt switch from [facility] to [facility] in Oct 2023   She then went off if it in Feb 2024 to June 2024 (insurance issues )  Back on now    [facility] notes no difference in how she felt when off vs on these agents    The pt used to work out at gym for 1 to 2 hours  but since Feb 2023 has not been able to  Gets dizzy    She says her fluid intake is about 80 oz per day   She does not like salt   She just started Strattera.  Has noticed no change in symptoms      She has been seen At [facility]   First visit in March 2023  Recomm metoprolol 12.5 bid  Patient says that made her feel very bad   Echo in March 2023 was normal  Zio patch in Feb 2023 showed SR/ST   60 to 177 bpm  Average HR 101 bpm   Rae PVCs PACs  Repeat Zio patch in 2024 showed reported SVT Seen by EP in Feb 2024 Rudolpho Sevin)  Ques ST vs inapprop ST  Underwent EP study in March 2024.  No arrhythmias were induced.   Then recomm tilt table   BP did not drop significant during study   SHe did develop tachycardia  Recomm hydration and consideration of midodrine or florinef  Not started   The pt has not been back   SInce seen she feels about the same     Diet:    Br  Skip or will have crackers  Lunch  Protein  Tuna, chicken   Salad    Protein shake or protein bar   Raw veggies  Dinner   Same         Current Meds  Medication Sig   Albuterol Sulfate (PROAIR RESPICLICK) 108 (90 Base) MCG/ACT AEPB Inhale 2 puffs into the lungs every 6 (six) hours as needed. (Patient taking differently: Inhale 2 puffs into the lungs every 6 (six) hours as needed (wheezing/shortness of breath).)   atomoxetine (STRATTERA) 40 MG capsule Take 40 mg by mouth 2 (two) times daily with a meal.   hydrOXYzine (ATARAX) 10 MG tablet Take 10 mg by mouth 3 (three) times daily as needed for anxiety.   hydrOXYzine (ATARAX) 50 MG tablet Take 50 mg by mouth daily.   ipratropium-albuterol (DUONEB) 0.5-2.5 (3) MG/3ML SOLN Inhale 3 mLs into the lungs every 2 (two) hours as needed (shortness of breath).   ondansetron (ZOFRAN) 4  MG tablet Take 4 mg by mouth 2 (two) times daily as needed.   pantoprazole (PROTONIX) 20 MG tablet Take 20 mg by mouth daily as needed for heartburn.   WEGOVY 2.4 MG/0.75ML SOAJ Inject 2.4 mg into the skin once a week.     Allergies:   Latex, Ciprofloxacin hcl, Codeine, and Clarithromycin   Past Medical History:  Diagnosis Date   Asthma    PCOS (polycystic ovarian syndrome)    Tachycardia     Past Surgical History:  Procedure Laterality Date   ADENOIDECTOMY     TYMPANOSTOMY TUBE PLACEMENT       Social History:  The patient  reports that she has never smoked. She has never used smokeless tobacco. She reports current alcohol use. She reports that she does not use drugs.   Family History:  The patient's family history includes Anemia in her mother.    ROS:  Please see the history of present illness. All other systems are reviewed and  Negative to the above problem except as noted.    PHYSICAL EXAM: VS:  BP 116/80   Pulse 81   Ht 5\' 4"  (1.626 m)   Wt 182 lb 3.2 oz (82.6 kg)   SpO2 99%   BMI 31.27  kg/m    BP laying 116/78  P 76   Sitting   116/80  P 95  Standing 0 min   110/68   P 105  Standing 3 min   118/84  P 104   Pt dizzy with sitting and standing    GEN: Pt is in no acute distress  HEENT: normal  Neck: no JVD, carotid bruits Cardiac: RRR; no murmurs,   No LE edema  Respiratory:  clear to auscultation bilaterally, GI: soft, nontender  No hepatomegaly  MS: no deformity Moving all extremities   Skin: warm and dry, no rash Neuro:  Grossly intact Psych: euthymic mood, full affect   EKG:  EKG is ordered today.  SR  81 bpm  Nonspecific ST changes    Lipid Panel No results found for: "CHOL", "TRIG", "HDL", "CHOLHDL", "VLDL", "LDLCALC", "LDLDIRECT"    Wt Readings from Last 3 Encounters:  05/19/23 182 lb 3.2 oz (82.6 kg)  08/28/22 190 lb (86.2 kg)  12/09/19 180 lb (81.6 kg)      ASSESSMENT AND PLAN:  1  Palpitations/ dizziness   The pt has about a 1 1/2 year hx of this    No syncope but close    Has had extensive eval to day   Tilt table did show tachycardia with standing consistent with POTS   No evid for SVT other than on Zio patch  EP study was negatvie  Today she gets tachy with standing   Dizzy with this as well  The pt did not tolerate metoprolol Given this I would not try pindolol Though it could be helpful on small vasculature and with HR I don't think she would tolerate florinef well but may need to onsider given prob low Na intake I would recomm  trying ivabradine 2.5 mg bid    Follow response  Other recommenations; Stay hydrated    INcorporate more salt in diet Try to increase activity esp (walking, biking, resistance training for legs) Elevate head of bed  SPANX  2  Headache  May be in response to problem 1  Follow     Current medicines are reviewed at length with the patient today.  The patient does not have concerns regarding medicines.  Signed, Dietrich Pates, MD  05/19/2023 8:30 AM    [facility] [address] [place],  [place], [place]  [address] Phone: [phone]; Fax: [phone]

## 2023-05-21 ENCOUNTER — Telehealth: Payer: Self-pay | Admitting: Internal Medicine

## 2023-05-21 ENCOUNTER — Encounter: Payer: Self-pay | Admitting: Internal Medicine

## 2023-05-21 MED ORDER — IVABRADINE HCL 5 MG PO TABS: 2.5000 mg | ORAL_TABLET | Freq: Two times a day (BID) | ORAL | 0 refills | Status: DC

## 2023-05-21 NOTE — Telephone Encounter (Signed)
Called and spoke directly with patient who verbalzied understanding of Kari Reilly' recommendations. Medication sent to pharmacy on file. F/u appt scheduled for 07/02/23 at 10am.

## 2023-05-21 NOTE — Addendum Note (Signed)
Addended by: Lars Mage on: 05/21/2023 09:18 AM   Modules accepted: Orders

## 2023-05-21 NOTE — Telephone Encounter (Signed)
After review of records I would recomm stariting  Corlanor 2.5 mg bid     Stay hydrated   Try to incoporate saltier foods into diet SPANX Try to increase activity (in cooler areas)   Follow up in clinic in 6 wks

## 2023-05-24 ENCOUNTER — Other Ambulatory Visit (HOSPITAL_COMMUNITY): Payer: Self-pay

## 2023-05-24 ENCOUNTER — Telehealth: Payer: Self-pay

## 2023-05-24 NOTE — Telephone Encounter (Signed)
Pharmacy Patient Advocate Encounter  Prior Authorization for Retia Passe has been approved.    PA# [id number] Effective dates: THRU 11/24/23  Haze Rushing, CPhT Pharmacy Patient Advocate Specialist Direct Number: [phone] Fax: [phone]

## 2023-05-24 NOTE — Telephone Encounter (Signed)
Called pt advised that prior authorization for Corlanor was approved. Advised that pharmacy should be reaching out to pt with cost and pick up information.

## 2023-05-24 NOTE — Telephone Encounter (Signed)
Pharmacy Patient Advocate Encounter   Received notification from [facility] that prior authorization for [facility] is needed.    PA submitted on 05/24/23 Key [id number] Status is pending  Haze Rushing, CPhT Pharmacy Patient Advocate Specialist Direct Number: [phone] Fax: [phone]

## 2023-05-24 NOTE — Telephone Encounter (Signed)
PA initiated, please see separate encounter for updates on determination. (I will route you back in once a decision has been made)  Ethon Wymer, CPhT Pharmacy Patient Advocate Specialist Direct Number: [phone] Fax: [phone]  

## 2023-05-25 NOTE — Telephone Encounter (Signed)
Pt is trying to get pregnant   Would not recomm ivabradine  Given that I would recomm staying hydrated, increase salt and fluid Try to increase activity

## 2023-07-01 ENCOUNTER — Ambulatory Visit
Admission: RE | Admit: 2023-07-01 | Discharge: 2023-07-01 | Disposition: A | Discharge status: Home / Self Care | Payer: No Typology Code available for payment source | Source: Ambulatory Visit | Attending: Internal Medicine | Admitting: Internal Medicine

## 2023-07-01 ENCOUNTER — Ambulatory Visit: Payer: No Typology Code available for payment source

## 2023-07-01 VITALS — BP 134/92 | HR 117 | Temp 98.2°F | Resp 19 | Wt 175.0 lb

## 2023-07-01 DIAGNOSIS — J069 Acute upper respiratory infection, unspecified: Secondary | ICD-10-CM

## 2023-07-01 DIAGNOSIS — Z3202 Encounter for pregnancy test, result negative: Secondary | ICD-10-CM | POA: Diagnosis not present

## 2023-07-01 DIAGNOSIS — J4521 Mild intermittent asthma with (acute) exacerbation: Secondary | ICD-10-CM | POA: Diagnosis not present

## 2023-07-01 HISTORY — DX: Postural orthostatic tachycardia syndrome (POTS): G90.A

## 2023-07-01 LAB — POCT URINE PREGNANCY: Preg Test, Ur: NEGATIVE

## 2023-07-01 MED ORDER — PREDNISONE 20 MG PO TABS: 40.0000 mg | ORAL_TABLET | Freq: Every day | ORAL | 0 refills | Status: AC

## 2023-07-01 NOTE — ED Provider Notes (Signed)
[facility]    CSN: [id number] Arrival date & time: 07/01/23  1710      History   Chief Complaint Chief Complaint  Patient presents with   URI    Entered by patient   Cough   Fever    HPI Kari Reilly is a 26 y.o. female.   Patient presents for further evaluation given fatigue, cough, fever, nasal congestion, sinus pressure.  Reports that she had COVID-19 last week and took Paxlovid for this.  Symptoms resolved.  These current symptoms started last night.  Tmax at home was 102.  Reports that she has asthma and has been having shortness of breath with just sitting still.  She has used her albuterol inhaler with no minimal improvement.  Denies any new known sick contacts.  Reports that she had a virtual visit yesterday and was prescribed Augmentin.  Patient reports history of POTS and her heart rate fluctuates from 110s to 150s.  She is not currently taking medication for it given that she is currently trying to conceive.   URI Cough Fever   Past Medical History:  Diagnosis Date   Asthma    PCOS (polycystic ovarian syndrome)    POTS (postural orthostatic tachycardia syndrome)    Tachycardia     There are no problems to display for this patient.   Past Surgical History:  Procedure Laterality Date   ADENOIDECTOMY     IR ANGIOGRAM FOLLOW UP STUDY     TYMPANOSTOMY TUBE PLACEMENT      OB History   No obstetric history on file.      Home Medications    Prior to Admission medications   Medication Sig Start Date End Date Taking? Authorizing Provider  atomoxetine (STRATTERA) 40 MG capsule Take 40 mg by mouth 2 (two) times daily with a meal. 05/04/23  Yes [provider]  pantoprazole (PROTONIX) 20 MG tablet Take 20 mg by mouth daily as needed for heartburn.   Yes [provider]  predniSONE (DELTASONE) 20 MG tablet Take 2 tablets (40 mg total) by mouth daily for 5 days. 07/01/23 07/06/23 Yes Rajah Tagliaferro, Acie Fredrickson, FNP  Albuterol Sulfate (PROAIR  RESPICLICK) 108 (90 Base) MCG/ACT AEPB Inhale 2 puffs into the lungs every 6 (six) hours as needed. Patient taking differently: Inhale 2 puffs into the lungs every 6 (six) hours as needed (wheezing/shortness of breath). 04/17/18   Bing Neighbors, NP  hydrOXYzine (ATARAX) 10 MG tablet Take 10 mg by mouth 3 (three) times daily as needed for anxiety. 12/21/22   [provider]  hydrOXYzine (ATARAX) 50 MG tablet Take 50 mg by mouth daily. 12/21/22   [provider]  ipratropium-albuterol (DUONEB) 0.5-2.5 (3) MG/3ML SOLN Inhale 3 mLs into the lungs every 2 (two) hours as needed (shortness of breath). 08/15/20   [provider]  ivabradine (CORLANOR) 5 MG TABS tablet Take 0.5 tablets (2.5 mg total) by mouth 2 (two) times daily with a meal. 05/21/23   Pricilla Riffle, MD  ondansetron (ZOFRAN) 4 MG tablet Take 4 mg by mouth 2 (two) times daily as needed. 04/21/23   [provider]  WEGOVY 2.4 MG/0.75ML SOAJ Inject 2.4 mg into the skin once a week. 09/28/22   [provider]    Family History Family History  Problem Relation Age of Onset   Anemia Mother     Social History Social History   Tobacco Use   Smoking status: Never   Smokeless tobacco: Never  Vaping  Use   Vaping status: Never Used  Substance Use Topics   Alcohol use: Yes    Comment: occasional   Drug use: No     Allergies   Latex, Ciprofloxacin hcl, Codeine, and Clarithromycin   Review of Systems Review of Systems Per HPI  Physical Exam Triage Vital Signs ED Triage Vitals  Encounter Vitals Group     BP 07/01/23 1747 (!) 134/92     Systolic BP Percentile --      Diastolic BP Percentile --      Pulse Rate 07/01/23 1747 (!) 117     Resp 07/01/23 1747 19     Temp 07/01/23 1747 98.2 F (36.8 C)     Temp Source 07/01/23 1747 Oral     SpO2 07/01/23 1747 94 %     Weight 07/01/23 1746 175 lb (79.4 kg)     Height --      Head Circumference --      Peak Flow --      Pain Score --       Pain Loc --      Pain Education --      Exclude from Growth Chart --    No data found.  Updated Vital Signs BP (!) 134/92 (BP Location: Left Arm)   Pulse (!) 117   Temp 98.2 F (36.8 C) (Oral)   Resp 19   Wt 175 lb (79.4 kg)   SpO2 94%   BMI 30.04 kg/m   Visual Acuity Right Eye Distance:   Left Eye Distance:   Bilateral Distance:    Right Eye Near:   Left Eye Near:    Bilateral Near:     Physical Exam Constitutional:      General: She is not in acute distress.    Appearance: Normal appearance. She is not toxic-appearing or diaphoretic.  HENT:     Head: Normocephalic and atraumatic.     Right Ear: Tympanic membrane and ear canal normal.     Left Ear: Tympanic membrane and ear canal normal.     Nose: Congestion present.     Mouth/Throat:     Mouth: Mucous membranes are moist.     Pharynx: No posterior oropharyngeal erythema.  Eyes:     Extraocular Movements: Extraocular movements intact.     Conjunctiva/sclera: Conjunctivae normal.     Pupils: Pupils are equal, round, and reactive to light.  Cardiovascular:     Rate and Rhythm: Normal rate and regular rhythm.     Pulses: Normal pulses.     Heart sounds: Normal heart sounds.  Pulmonary:     Effort: Pulmonary effort is normal. No respiratory distress.     Breath sounds: Normal breath sounds. No stridor. No wheezing, rhonchi or rales.  Abdominal:     General: Abdomen is flat. Bowel sounds are normal.     Palpations: Abdomen is soft.  Musculoskeletal:        General: Normal range of motion.     Cervical back: Normal range of motion.  Skin:    General: Skin is warm and dry.  Neurological:     General: No focal deficit present.     Mental Status: She is alert and oriented to person, place, and time. Mental status is at baseline.  Psychiatric:        Mood and Affect: Mood normal.        Behavior: Behavior normal.      UC Treatments / Results  Labs (all labs ordered are  listed, but only abnormal results are  displayed) Labs Reviewed  POCT URINE PREGNANCY    EKG   Radiology DG Chest 2 View  Result Date: 07/01/2023 CLINICAL DATA:  shortness of breath, cough EXAM: CHEST - 2 VIEW COMPARISON:  Chest x-ray 12/09/2019 FINDINGS: The heart size and mediastinal contours are within normal limits. Both lungs are clear. The visualized skeletal structures are unremarkable. IMPRESSION: No active cardiopulmonary disease. Electronically Signed   By: Tish Frederickson M.D.   On: 07/01/2023 19:38    Procedures Procedures (including critical care time)  Medications Ordered in UC Medications - No data to display  Initial Impression / Assessment and Plan / UC Course  I have reviewed the triage vital signs and the nursing notes.  Pertinent labs & imaging results that were available during my care of the patient were reviewed by me and considered in my medical decision making (see chart for details).     Chest x-ray completed that was negative for any acute cardiopulmonary process.  Suspect possible new onset viral illness that could be causing asthma flareup.  I do think patient would benefit from steroid therapy but she does have history of POTS.  Discussed adverse effects of steroid therapy that could be caused.  Although, I do think that benefits outweigh risks associated with shortness of breath and the patient was agreeable to doing steroid burst.  Advised to continue albuterol as needed.  Patient to continue Augmentin that was prescribed yesterday as well.  Patient is not in any acute distress and vital signs are baseline for patient per her report so do not think that emergent evaluation is necessary.  Urine pregnancy test completed prior to x-ray given patient is currently trying to conceive.  Advised strict return and ER precautions.  Patient verbalized understanding and was agreeable with plan. Final Clinical Impressions(s) / UC Diagnoses   Final diagnoses:  Acute upper respiratory infection  Mild  intermittent asthma with acute exacerbation  Urine pregnancy test negative     Discharge Instructions      Chest x-ray was normal.  I have prescribed you prednisone to decrease inflammation associated with your asthma.  Continue Augmentin.  Follow-up if symptoms persist or worsen.     ED Prescriptions     Medication Sig Dispense Auth. Provider   predniSONE (DELTASONE) 20 MG tablet Take 2 tablets (40 mg total) by mouth daily for 5 days. 10 tablet Gustavus Bryant, [place]      PDMP not reviewed this encounter.   Gustavus Bryant, [place] 07/01/23 2003

## 2023-07-01 NOTE — Discharge Instructions (Signed)
Chest x-ray was normal.  I have prescribed you prednisone to decrease inflammation associated with your asthma.  Continue Augmentin.  Follow-up if symptoms persist or worsen.

## 2023-07-01 NOTE — ED Triage Notes (Signed)
Covid last week. Took paxlovid Better over the weekend. Taken 1000 of tylenol. New sx started Monday. Currently on Augmentin.    Sx: fatigue,no taste or smell, cough, fever,

## 2023-07-02 ENCOUNTER — Ambulatory Visit: Payer: No Typology Code available for payment source | Admitting: Internal Medicine

## 2023-08-30 ENCOUNTER — Encounter: Payer: Self-pay | Admitting: Internal Medicine

## 2023-08-30 DIAGNOSIS — G43809 Other migraine, not intractable, without status migrainosus: Secondary | ICD-10-CM

## 2023-08-30 NOTE — Telephone Encounter (Signed)
Can try propranolol 10 mg bid MgOxide may help 400 mg daily (safe)  Dr Everlena Cooper  is HA specialist in [place]  If trying to get pregnant confirm off ivabradine

## 2023-08-31 ENCOUNTER — Encounter: Payer: Self-pay | Admitting: Neurology

## 2023-08-31 MED ORDER — MAGNESIUM OXIDE 400 MG PO CAPS: 400.0000 mg | ORAL_CAPSULE | Freq: Every day | ORAL | 3 refills | Status: DC

## 2023-08-31 MED ORDER — PROPRANOLOL HCL 10 MG PO TABS: 10.0000 mg | ORAL_TABLET | Freq: Two times a day (BID) | ORAL | 3 refills | Status: DC

## 2023-09-10 NOTE — Addendum Note (Signed)
Addended by: Bertram Millard on: 09/10/2023 09:44 AM   Modules accepted: Orders

## 2023-09-17 ENCOUNTER — Other Ambulatory Visit: Payer: Self-pay | Admitting: Medical Genetics

## 2023-09-17 DIAGNOSIS — Z006 Encounter for examination for normal comparison and control in clinical research program: Secondary | ICD-10-CM

## 2023-09-30 NOTE — Progress Notes (Signed)
NEUROLOGY CONSULTATION NOTE  Kari Reilly MRN: [id number] DOB: Sep 06, 1996  Referring provider: Dietrich Pates, MD Primary care provider: Marvis Repress, MD  Reason for consult:  migraines  Assessment/Plan:   Migraine without aura, without status migrainosus, not intractable Chronic tension-type headache, not intractable   Due to onset of daily headaches without explanation, check MRI of brain with and without contrast Migraine prevention:  Keeping in mind that she is trying to get pregnant, will start nortriptyline 10mg  at bedtime.  We can increase to 25mg  at bedtime in 4 weeks if needed.  Due to low-risk of serotonin syndrome, advised to try taking without trazodone, as nortriptyline may help with the insomnia.  Would discontinue if she becomes pregnant.  She will also try taking B2 400mg  daily.  Can discontinued propranolol as I would not ant to increase dose due to her POTS. Migraine rescue:  She will try Maxalt-MLT 10mg , take Zofran for nausea Limit use of pain relievers to no more than 2 days out of week to prevent risk of rebound or medication-overuse headache. Keep headache diary Follow up 5 months.      Subjective:  Kari Reilly is a 27 year old right-handed female with POTS, asthma and PCOS who presents for migraines.  History supplemented by referring provider's note.  Migraines:  She has had migraines since middle school.  Initially would get 2 a year.  In late 2023-early 2024, they started becoming more frequent, now 4 times a month.  They are 7/10 throbbing headache across forehead and temples that can migrate to back of head.  Associated with nausea, vomiting, photophobia, and sometimes sees floaters.  Lasts 8-12 hours and occurs.  No known triggers.  Wearing a cold cap helps.  Over the counter NSAIDs and analgesics ineffective, so she doesn't take them.  Chronic tension-type headaches:  Around the same time, she started getting new headaches, less severe, 4/10.   They are in the same distribution as the migraines, sharp in the mid frontal region, otherwise a pressure.  Maybe slight associated photophobia but otherwise no associated symptoms.  They occur all day and are daily.  Does not treat with analgesics.    She cannot think of preceding trigger or cause for increased headaches.  No new stress.  In fact, stress level decreased when headaches started.  No new medication or change in environment.  She had COVID for the first time this year in 2024 but no preceding illness.    Since 2023, she started exhibiting dizziness.  Advised to lose weight and she lost 100 lbs but symptoms persisted.  She was diagnosed with POTS this year.    She is currently trying to get pregnant.    Past NSAIDS/analgesics:  Excedrin, Tylenol, ibuprofen, naproxen Past abortive triptans:  none Past abortive ergotamine:  none Past muscle relaxants:  Flexeril Past anti-emetic:  Phenergan Past antihypertensive medications:  none Past antidepressant medications:  amitriptyline, sertraline, Wellbutrin (worsened headaches) Past anticonvulsant medications:  none Past anti-CGRP:  none Past vitamins/Herbal/Supplements:  magnesium oxide (rash)  Current NSAIDS/analgesics:  none Current triptans:  none Current ergotamine:  none Current anti-emetic:  Zofran 4mg  Current muscle relaxants:  none Current Antihypertensive medications:  propranolol 10mg  twice daily (started a month ago) Current Antidepressant medications:  none Current Anticonvulsant medications:  none Current anti-CGRP:  none Current Vitamins/Herbal/Supplements:  CoQ10 600mg  daily, prenatal Current Antihistamines/Decongestants:  none Other therapy:  none Birth control:  none Other medications:  trazodone, Straterra   Caffeine:  1-2 times  a week sweet tea or Dr. Reino Kent Diet:  100 oz water daily.  Protein bar for breakfast and lunch.  High-protein dinner Exercise:  tries to walk 1-2 miles 4 times a week (limited due  to POTS) Depression:  no; Anxiety:  yes Other pain:  back pain Sleep hygiene:  Good with trazodone Family history of headache:  mom (migraines), sisters (migraines)      PAST MEDICAL HISTORY: Past Medical History:  Diagnosis Date   Asthma    PCOS (polycystic ovarian syndrome)    POTS (postural orthostatic tachycardia syndrome)    Tachycardia     PAST SURGICAL HISTORY: Past Surgical History:  Procedure Laterality Date   ADENOIDECTOMY     IR ANGIOGRAM FOLLOW UP STUDY     TYMPANOSTOMY TUBE PLACEMENT      MEDICATIONS: Current Outpatient Medications on File Prior to Visit  Medication Sig Dispense Refill   Albuterol Sulfate (PROAIR RESPICLICK) 108 (90 Base) MCG/ACT AEPB Inhale 2 puffs into the lungs every 6 (six) hours as needed. (Patient taking differently: Inhale 2 puffs into the lungs every 6 (six) hours as needed (wheezing/shortness of breath).) 1 each 0   atomoxetine (STRATTERA) 40 MG capsule Take 40 mg by mouth 2 (two) times daily with a meal.     buPROPion (WELLBUTRIN XL) 150 MG 24 hr tablet Take 150 mg by mouth daily.     ipratropium-albuterol (DUONEB) 0.5-2.5 (3) MG/3ML SOLN Inhale 3 mLs into the lungs every 2 (two) hours as needed (shortness of breath).     meloxicam (MOBIC) 15 MG tablet Take 15 mg by mouth daily.     ondansetron (ZOFRAN) 4 MG tablet Take 4 mg by mouth 2 (two) times daily as needed.     pantoprazole (PROTONIX) 20 MG tablet Take 20 mg by mouth daily as needed for heartburn.     hydrOXYzine (ATARAX) 10 MG tablet Take 10 mg by mouth 3 (three) times daily as needed for anxiety. (Patient not taking: Reported on 10/04/2023)     hydrOXYzine (ATARAX) 50 MG tablet Take 50 mg by mouth daily. (Patient not taking: Reported on 10/04/2023)     metFORMIN (GLUCOPHAGE) 500 MG tablet Take 500 mg by mouth daily.     No current facility-administered medications on file prior to visit.     ALLERGIES: Allergies  Allergen Reactions   Latex Rash   Ciprofloxacin Hcl Hives    Codeine Other (See Comments)    "acts crazy"   Magnesium Oxide -Mg Supplement     Hives and itching on magnesium oxide   Clarithromycin Rash    FAMILY HISTORY: Family History  Problem Relation Age of Onset   Anemia Mother     Objective:  Blood pressure 105/74, pulse 77, resp. rate 20, height 5\' 5"  (1.651 m), weight 199 lb (90.3 kg), SpO2 100%. General: No acute distress.  Patient appears well-groomed.   Head:  Normocephalic/atraumatic, bilateral (right greater than left) suboccipital tenderness Eyes:  fundi examined but not visualized Neck: supple, bilateral upper paraspinal tenderness, full range of motion Heart: regular rate and rhythm Neurological Exam: Mental status: alert and oriented to person, place, and time, speech fluent and not dysarthric, language intact. Cranial nerves: CN I: not tested CN II: pupils equal, round and reactive to light, visual fields intact CN III, IV, VI:  full range of motion, no nystagmus, no ptosis CN V: facial sensation intact. CN VII: upper and lower face symmetric CN VIII: hearing intact CN IX, X: gag intact, uvula midline CN XI:  sternocleidomastoid and trapezius muscles intact CN XII: tongue midline Bulk & Tone: normal, no fasciculations. Motor:  muscle strength 5/5 throughout Sensation:  Pinprick and vibratory sensation intact. Deep Tendon Reflexes:  2+ throughout,  toes downgoing.   Finger to nose testing:  Without dysmetria.   Gait:  Normal station and stride.  Romberg negative.    Thank you for allowing me to take part in the care of this patient.  Shon Millet, DO  CC:  Dietrich Pates, MD  Marvis Repress, MD

## 2023-10-04 ENCOUNTER — Encounter: Payer: Self-pay | Admitting: Neurology

## 2023-10-04 ENCOUNTER — Ambulatory Visit (INDEPENDENT_AMBULATORY_CARE_PROVIDER_SITE_OTHER): Payer: No Typology Code available for payment source | Admitting: Neurology

## 2023-10-04 VITALS — BP 105/74 | HR 77 | Resp 20 | Ht 65.0 in | Wt 199.0 lb

## 2023-10-04 DIAGNOSIS — G44229 Chronic tension-type headache, not intractable: Secondary | ICD-10-CM

## 2023-10-04 DIAGNOSIS — G43009 Migraine without aura, not intractable, without status migrainosus: Secondary | ICD-10-CM | POA: Diagnosis not present

## 2023-10-04 MED ORDER — RIZATRIPTAN BENZOATE 10 MG PO TBDP: 10.0000 mg | ORAL_TABLET | ORAL | 5 refills | Status: DC | PRN

## 2023-10-04 MED ORDER — NORTRIPTYLINE HCL 10 MG PO CAPS: 10.0000 mg | ORAL_CAPSULE | Freq: Every day | ORAL | 5 refills | Status: DC

## 2023-10-04 NOTE — Patient Instructions (Signed)
  Check MRI of brain with and without contrast Start nortriptyline 10mg  at bedtime.  Contact us in 4 weeks with update and we can increase dose if needed.  Ideally, I would not like you to take with trazodone.  See how you do off trazodone for now. Take rizatriptan at earliest onset of headache.  May repeat dose once in 2 hours if needed.  Maximum 2 tablets in 24 hours. Take Zofran at earliest onset of migraine as well Limit use of pain relievers to no more than 2 days out of the week.  These medications include acetaminophen, NSAIDs (ibuprofen/Advil/Motrin, naproxen/Aleve, triptans (Imitrex/sumatriptan), Excedrin, and narcotics.  This will help reduce risk of rebound headaches. Be aware of common food triggers Routine exercise Stay adequately hydrated (aim for 64 oz water daily) Keep headache diary Maintain proper stress management Maintain proper sleep hygiene Do not skip meals Consider supplements:  Riboflavin 400mg  daily in addition to CoQ10

## 2023-10-09 ENCOUNTER — Other Ambulatory Visit: Payer: Self-pay

## 2023-10-26 ENCOUNTER — Other Ambulatory Visit: Payer: No Typology Code available for payment source

## 2023-10-29 ENCOUNTER — Ambulatory Visit
Admission: RE | Admit: 2023-10-29 | Discharge: 2023-10-29 | Disposition: A | Discharge status: Home / Self Care | Payer: No Typology Code available for payment source | Source: Ambulatory Visit | Attending: Neurology | Admitting: Neurology

## 2023-10-29 MED ORDER — GADOPICLENOL 0.5 MMOL/ML IV SOLN
9.0000 mL | Freq: Once | INTRAVENOUS | Status: AC | PRN
  Administered 2023-10-29: 9 mL via INTRAVENOUS

## 2023-11-01 ENCOUNTER — Telehealth: Payer: Self-pay | Admitting: Neurology

## 2023-11-01 ENCOUNTER — Encounter: Payer: Self-pay | Admitting: Neurology

## 2023-11-01 ENCOUNTER — Other Ambulatory Visit: Payer: Self-pay | Admitting: Neurology

## 2023-11-01 MED ORDER — NORTRIPTYLINE HCL 25 MG PO CAPS: 25.0000 mg | ORAL_CAPSULE | Freq: Every day | ORAL | 1 refills | Status: DC

## 2023-11-01 NOTE — Telephone Encounter (Signed)
Patient called back stating we didn't get in touch with her eye doctor in time ( we didn't send jaffe's notes to them ) and they will not see her today. They said its been an hour or longer so they refused to see her. She will not have new insurance until 60 days, since she is leaving her new job and wont have ins till feb. She will get in touch with jaffe once she gets her new ins and get the spinal tap/eye exam and all that she needs

## 2023-11-01 NOTE — Telephone Encounter (Signed)
Called and spoke with Kari Reilly about MRI results.  Constellation of findings suggestive of idiopathic intracranial hypertension.  She will try to get an eye exam soon and have notes sent to me.  Regardless of eye exam, plan would be for LP to measure opening pressure.  In meantime, nortriptyline ineffective.  Will increase to 25mg  at bedtime

## 2023-11-01 NOTE — Telephone Encounter (Signed)
Caller stated her eye care is denying her eye exam without office notes from Dr Everlena Cooper. Called office to let Dr Everlena Cooper know [facility] Phone: [phone] Fax: [phone]

## 2023-11-03 ENCOUNTER — Other Ambulatory Visit: Payer: No Typology Code available for payment source

## 2023-12-06 ENCOUNTER — Encounter: Payer: Self-pay | Admitting: Neurology

## 2023-12-07 ENCOUNTER — Other Ambulatory Visit: Payer: Self-pay | Admitting: Neurology

## 2023-12-08 ENCOUNTER — Other Ambulatory Visit: Payer: Self-pay | Admitting: Neurology

## 2023-12-08 MED ORDER — SUMATRIPTAN SUCCINATE 100 MG PO TABS: ORAL_TABLET | ORAL | 5 refills | Status: DC

## 2023-12-09 ENCOUNTER — Encounter: Payer: Self-pay | Admitting: Neurology

## 2023-12-20 ENCOUNTER — Other Ambulatory Visit: Payer: Self-pay

## 2024-01-20 ENCOUNTER — Other Ambulatory Visit (HOSPITAL_COMMUNITY): Payer: Self-pay

## 2024-01-20 MED ORDER — METRONIDAZOLE 0.75 % EX GEL
1.0000 | Freq: Two times a day (BID) | CUTANEOUS | 99 refills | Status: DC
  Filled 2024-01-20: qty 45, 23d supply, fill #0

## 2024-01-24 ENCOUNTER — Ambulatory Visit: Payer: Self-pay | Admitting: Women's Health

## 2024-01-31 ENCOUNTER — Telehealth: Admitting: Physician Assistant

## 2024-01-31 DIAGNOSIS — R3989 Other symptoms and signs involving the genitourinary system: Secondary | ICD-10-CM

## 2024-01-31 MED ORDER — CEPHALEXIN 500 MG PO CAPS: 500.0000 mg | ORAL_CAPSULE | Freq: Two times a day (BID) | ORAL | 0 refills | Status: AC

## 2024-01-31 NOTE — Progress Notes (Signed)
 I have spent 5 minutes in review of e-visit questionnaire, review and updating patient chart, medical decision making and response to patient.   Piedad Climes, PA-C

## 2024-01-31 NOTE — Progress Notes (Signed)

## 2024-02-07 ENCOUNTER — Encounter: Payer: Self-pay | Admitting: Women's Health

## 2024-02-07 ENCOUNTER — Other Ambulatory Visit (HOSPITAL_COMMUNITY)
Admission: RE | Admit: 2024-02-07 | Discharge: 2024-02-07 | Disposition: A | Discharge status: Home / Self Care | Source: Ambulatory Visit | Attending: Women's Health | Admitting: Women's Health

## 2024-02-07 ENCOUNTER — Ambulatory Visit: Payer: Self-pay | Admitting: Women's Health

## 2024-02-07 VITALS — BP 111/78 | HR 89 | Ht 65.0 in | Wt 219.0 lb

## 2024-02-07 DIAGNOSIS — N926 Irregular menstruation, unspecified: Secondary | ICD-10-CM | POA: Diagnosis not present

## 2024-02-07 DIAGNOSIS — R87618 Other abnormal cytological findings on specimens from cervix uteri: Secondary | ICD-10-CM

## 2024-02-07 DIAGNOSIS — E282 Polycystic ovarian syndrome: Secondary | ICD-10-CM | POA: Insufficient documentation

## 2024-02-07 DIAGNOSIS — Z01419 Encounter for gynecological examination (general) (routine) without abnormal findings: Secondary | ICD-10-CM | POA: Diagnosis not present

## 2024-02-07 DIAGNOSIS — F909 Attention-deficit hyperactivity disorder, unspecified type: Secondary | ICD-10-CM | POA: Insufficient documentation

## 2024-02-07 DIAGNOSIS — Z8742 Personal history of other diseases of the female genital tract: Secondary | ICD-10-CM

## 2024-02-07 DIAGNOSIS — D069 Carcinoma in situ of cervix, unspecified: Secondary | ICD-10-CM | POA: Insufficient documentation

## 2024-02-07 DIAGNOSIS — J45909 Unspecified asthma, uncomplicated: Secondary | ICD-10-CM | POA: Insufficient documentation

## 2024-02-07 DIAGNOSIS — I493 Ventricular premature depolarization: Secondary | ICD-10-CM | POA: Insufficient documentation

## 2024-02-07 DIAGNOSIS — Z124 Encounter for screening for malignant neoplasm of cervix: Secondary | ICD-10-CM | POA: Diagnosis present

## 2024-02-07 DIAGNOSIS — R87619 Unspecified abnormal cytological findings in specimens from cervix uteri: Secondary | ICD-10-CM | POA: Insufficient documentation

## 2024-02-07 DIAGNOSIS — Z808 Family history of malignant neoplasm of other organs or systems: Secondary | ICD-10-CM

## 2024-02-07 LAB — POCT URINE PREGNANCY: Preg Test, Ur: NEGATIVE

## 2024-02-07 NOTE — Progress Notes (Unsigned)
 WELL-WOMAN EXAMINATION Patient name: Kari Reilly MRN [id number]  Date of birth: 1996-04-01 Chief Complaint:   Gynecologic Exam  History of Present Illness:   Kari Reilly is a 28 y.o. G0P0000 Caucasian female being seen today for a routine well-woman exam. Thinks she might be pregnant, has had regular periods for 2 years, this period is late. Wears [place] ring and showed she may have ovulated last Monday- had sex then. Does not want to be pregnant right now. Some breast tenderness, no n/v.  Always has urinary frequency, being tx for UTI- just finished keflex today.  Constipation  PCP: Fran Lowes      does not desire labs Patient's last menstrual period was 12/16/2023. The current method of family planning is none, may want some if HCG neg, discussed options-wants pills, has migraines w/ aura so would be Slynd Last pap 10/06/23. 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 H/O abnormal pap: yes multiple w/ h/o unsuccessful LEEP 2023, recommended [facility] knife cone at [facility] but declined Last mammogram: never. Results were: N/A. Family h/o breast cancer: yes MGM in 75s Last colonoscopy: never. Results were: N/A. Family h/o colorectal cancer: yes PGF unknown age     02/07/2024    1:53 PM  Depression screen PHQ 2/9  Decreased Interest 1  Down, Depressed, Hopeless 1  PHQ - 2 Score 2  Altered sleeping 2  Tired, decreased energy 3  Change in appetite 1  Feeling bad or failure about yourself  1  Trouble concentrating 3  Moving slowly or fidgety/restless 1  Suicidal thoughts 0  PHQ-9 Score 13        02/07/2024    1:53 PM  GAD 7 : Generalized Anxiety Score  Nervous, Anxious, on Edge 2  Control/stop worrying 1  Worry too much - different things 2  Trouble relaxing 2  Restless 3  Easily annoyed or irritable 2  Afraid - awful might happen 0  Total GAD 7 Score 12     Review of Systems:   Pertinent items are noted in HPI Denies any headaches, blurred vision, fatigue,  shortness of breath, chest pain, abdominal pain, abnormal vaginal discharge/itching/odor/irritation, problems with periods, bowel movements, urination, or intercourse unless otherwise stated above. Pertinent History Reviewed:  Reviewed past medical,surgical, social and family history.  Reviewed problem list, medications and allergies. Physical Assessment:   Vitals:   02/07/24 1348  BP: 111/78  Pulse: 89  Weight: 219 lb (99.3 kg)  Height: 5\' 5"  (1.651 m)  Body mass index is 36.44 kg/m.        Physical Examination:   General appearance - well appearing, and in no distress  Mental status - alert, oriented to person, place, and time  Psych:  She has a normal mood and affect  Skin - warm and dry, normal color, no suspicious lesions noted  Chest - effort normal, all lung fields clear to auscultation bilaterally  Heart - normal rate and regular rhythm  Neck:  midline trachea, no thyromegaly or nodules  Breasts - breasts appear normal, no suspicious masses, no skin or nipple changes or  axillary nodes  Abdomen - soft, nontender, nondistended, no masses or organomegaly  Pelvic - VULVA: normal appearing vulva with no masses, tenderness or lesions  VAGINA: normal appearing vagina with normal color and discharge, no lesions  CERVIX: normal appearing cervix without discharge or lesions, no CMT  Thin prep pap is done w/ HR HPV cotesting  UTERUS: uterus is felt to be normal  size, shape, consistency and nontender   ADNEXA: No adnexal masses or tenderness noted.  Extremities:  No swelling or varicosities noted  Chaperone: Peggy Dones  No results found for this or any previous visit (from the past 24 hours).  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   Follow-up: will discuss after pap results  Cheral Marker CNM, [facility] 02/07/2024 2:30 PM   Discussed pap/colpo hx w/ Dr. Charlotta Newton, will see what this pap/colpo shows, can discuss w/ Dr. Pricilla Holm (gyn/onc) to determine POC (pt really doesn't want CKC/LEEP)- may would do cervical ablation.  Cheral Marker, CNM, [facility] 02/08/2024 12:16 PM

## 2024-02-08 ENCOUNTER — Encounter: Payer: Self-pay | Admitting: Women's Health

## 2024-02-08 LAB — BETA HCG QUANT (REF LAB): hCG Quant: <1 m[IU]/mL

## 2024-02-08 NOTE — Progress Notes (Unsigned)
 NEUROLOGY FOLLOW UP OFFICE NOTE  Kari Reilly [id number]  Assessment/Plan:   Migraine without aura, without status migrainosus, not intractable Chronic tension-type headache, not intractable   Migraine prevention:  Plan to start Aimovig 140mg  every 28 days Migraine rescue:  She will try samples of ubrelvy 100mg ; Zofran for nausea  Limit use of pain relievers to no more than 2 days out of week to prevent risk of rebound or medication-overuse headache. Keep headache diary She will get a dilated eye exam.   Follow up 6 months.      Subjective:  Kari Reilly is a 28 year old right-handed female with POTS, asthma and PCOS who follows up for migraines.  MRI of brain personally reviewed.  UPDATE: Started nortriptyline and increased to 50mg  at bedtime.    Ineffective, so she stopped it.  No longer trying to get pregnant.  Sumatriptan not as effective as rizatriptan-MLT Migraines:  Intensity:  4-5/10; Duration:  takes rizatriptan and sleeps for several hours; Frequency:  2-3 times a week  Tension-type headache:  Intensity:  6/10; Duration:  all day; Frequency: 4 to 5 a month  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 and vertical tortuosity of the optic nerves (findings that may be seen in idiopathic intracranial hypertension) as well as a tiny nonspecific chronic insult within the right parietal lobe.  Advised to get a repeat eye exam but she hasn't had an exam due to change in insurance.    Current NSAIDS/analgesics:  none Current triptans:  sumatriptan 100mg , rizatriptan 10mg  Current ergotamine:  none Current anti-emetic:  Zofran 4mg  Current muscle relaxants:  none Current Antihypertensive medications:  none Current Antidepressant medications:  nortriptyline 50mg  at bedtime Current Anticonvulsant medications:  none Current anti-CGRP:  none Current Vitamins/Herbal/Supplements:   CoQ10 600mg  daily, prenatal Current Antihistamines/Decongestants:  none Other therapy:  none Birth control:  none Other medications:  trazodone, Straterra   Caffeine:  1-2 times a week sweet tea or Dr. Reino Kent Diet:  100 oz water daily.  Protein bar for breakfast and lunch.  High-protein dinner Exercise:  tries to walk 1-2 miles 4 times a week (limited due to POTS) Depression:  no; Anxiety:  yes Other pain:  back pain Sleep hygiene:  Good with trazodone  HISTORY: Migraines:  She has had migraines since middle school.  Initially would get 2 a year.  In late 2023-early 2024, they started becoming more frequent, now 4 times a month.  They are 7/10 throbbing headache across forehead and temples that can migrate to back of head.  Associated with nausea, vomiting, photophobia, and sometimes sees floaters.  Lasts 8-12 hours and occurs.  No known triggers.  Wearing a cold cap helps.  Over the counter NSAIDs and analgesics ineffective, so she doesn't take them.  Chronic tension-type headaches:  Around the same time, she started getting new headaches, less severe, 4/10.  They are in the same distribution as the migraines, sharp in the mid frontal region, otherwise a pressure.  Maybe slight associated photophobia but otherwise no associated symptoms.  They occur all day and are daily.  Does not treat with analgesics.    She cannot think of preceding trigger or cause for increased headaches.  No new stress.  In fact, stress level decreased when headaches started.  No new medication or change in environment.  She had COVID for the first time this year in 2024 but no preceding illness.  Since 2023, she started exhibiting dizziness.  Advised to lose weight and she lost 100 lbs but symptoms persisted.  She was diagnosed with POTS in 2024.   She reports that around 2020 she had an eye exam that revealed abnormality on the optic nerve.  She saw a specialist who told her it wasn't anything concerning.    Past  NSAIDS/analgesics:  Excedrin, Tylenol, ibuprofen, naproxen Past abortive triptans:  rizatriptan Past abortive ergotamine:  none Past muscle relaxants:  Flexeril Past anti-emetic:  Phenergan Past antihypertensive medications:  propranolol 10mg  Past antidepressant medications:  amitriptyline, nortriptyline, sertraline, Wellbutrin (worsened headaches) Past anticonvulsant medications:  none Past anti-CGRP:  none Past vitamins/Herbal/Supplements:  magnesium oxide (rash)   Family history of headache:  mom (migraines), sisters (migraines)  PAST MEDICAL HISTORY: Past Medical History:  Diagnosis Date   Asthma    Headache    PCOS (polycystic ovarian syndrome)    POTS (postural orthostatic tachycardia syndrome)    Tachycardia     MEDICATIONS: Current Outpatient Medications on File Prior to Visit  Medication Sig Dispense Refill   Albuterol Sulfate (PROAIR RESPICLICK) 108 (90 Base) MCG/ACT AEPB Inhale 2 puffs into the lungs every 6 (six) hours as needed. (Patient not taking: Reported on 02/07/2024) 1 each 0   ipratropium-albuterol (DUONEB) 0.5-2.5 (3) MG/3ML SOLN Inhale 3 mLs into the lungs every 2 (two) hours as needed (shortness of breath).     metFORMIN (GLUCOPHAGE) 500 MG tablet Take 500 mg by mouth daily.     ondansetron (ZOFRAN) 4 MG tablet Take 4 mg by mouth 2 (two) times daily as needed.     pantoprazole (PROTONIX) 20 MG tablet Take 20 mg by mouth daily as needed for heartburn.     No current facility-administered medications on file prior to visit.    ALLERGIES: Allergies  Allergen Reactions   Ciprofloxacin Hives and Rash   Latex Rash   Ciprofloxacin Hcl Hives   Codeine Other (See Comments)    "acts crazy"   Magnesium Oxide -Mg Supplement     Hives and itching on magnesium oxide   Clarithromycin Rash    FAMILY HISTORY: Family History  Problem Relation Age of Onset   Anemia Mother       Objective:  Blood pressure 108/73, pulse 83, height 5\' 5"  (1.651 m), weight 219  lb 12.8 oz (99.7 kg), last menstrual period 12/16/2023, SpO2 98%. General: No acute distress.  Patient appears well-groomed.    Shon Millet, DO

## 2024-02-09 ENCOUNTER — Ambulatory Visit: Payer: No Typology Code available for payment source | Admitting: Neurology

## 2024-02-09 ENCOUNTER — Encounter: Payer: Self-pay | Admitting: Neurology

## 2024-02-09 VITALS — BP 108/73 | HR 83 | Ht 65.0 in | Wt 219.8 lb

## 2024-02-09 DIAGNOSIS — G43009 Migraine without aura, not intractable, without status migrainosus: Secondary | ICD-10-CM | POA: Diagnosis not present

## 2024-02-09 MED ORDER — AIMOVIG 140 MG/ML ~~LOC~~ SOAJ: 140.0000 mg | SUBCUTANEOUS | 5 refills | Status: DC

## 2024-02-09 MED ORDER — ONDANSETRON HCL 4 MG PO TABS: 4.0000 mg | ORAL_TABLET | Freq: Two times a day (BID) | ORAL | 5 refills | Status: DC | PRN

## 2024-02-09 NOTE — Patient Instructions (Addendum)
 Start Aimovig injection every 28 days.  If no improvement after 3 months on medication, contact me. At earliest onset of migraine, take Ubrelvy.  May repeat after 2 hours.  Maximum 2 tablets in 24 hours.  Let me know if it works.   Zofran for nausea. Follow up 6 months.

## 2024-02-10 LAB — CYTOLOGY - PAP
Chlamydia: NEGATIVE
Comment: NEGATIVE
Comment: NEGATIVE
Comment: NEGATIVE
Comment: NEGATIVE
Comment: NEGATIVE
Comment: NORMAL
Diagnosis: HIGH — AB
HPV 16: NEGATIVE
HPV 18 / 45: NEGATIVE
High risk HPV: POSITIVE — AB
Neisseria Gonorrhea: NEGATIVE
Trichomonas: NEGATIVE

## 2024-02-14 ENCOUNTER — Telehealth: Payer: Self-pay

## 2024-02-14 NOTE — Telephone Encounter (Signed)
 PA needed fr Aimovig.

## 2024-02-15 ENCOUNTER — Other Ambulatory Visit (HOSPITAL_COMMUNITY): Payer: Self-pay

## 2024-02-15 ENCOUNTER — Other Ambulatory Visit: Payer: Self-pay | Admitting: Women's Health

## 2024-02-15 ENCOUNTER — Encounter: Payer: Self-pay | Admitting: Women's Health

## 2024-02-15 ENCOUNTER — Telehealth: Payer: Self-pay | Admitting: Pharmacy Technician

## 2024-02-15 DIAGNOSIS — Z9889 Other specified postprocedural states: Secondary | ICD-10-CM | POA: Insufficient documentation

## 2024-02-15 DIAGNOSIS — R87613 High grade squamous intraepithelial lesion on cytologic smear of cervix (HGSIL): Secondary | ICD-10-CM

## 2024-02-15 NOTE — Telephone Encounter (Signed)
 Pharmacy Patient Advocate Encounter   Received notification from Pt Calls Messages that prior authorization for AIMOVIG 140MG  is required/requested.   Insurance verification completed.   The patient is insured through [employer] .   Per test claim: PA required; PA submitted to above mentioned insurance via CoverMyMeds Key/confirmation #/EOC [facility] Status is pending

## 2024-02-15 NOTE — Telephone Encounter (Signed)
 PA has been submitted, and telephone encounter has been created. Please see telephone encounter dated 4.1.25.

## 2024-02-17 ENCOUNTER — Other Ambulatory Visit (HOSPITAL_COMMUNITY)
Admission: RE | Admit: 2024-02-17 | Discharge: 2024-02-17 | Disposition: A | Discharge status: Home / Self Care | Source: Ambulatory Visit | Attending: Women's Health | Admitting: Women's Health

## 2024-02-17 ENCOUNTER — Encounter: Payer: Self-pay | Admitting: Women's Health

## 2024-02-17 ENCOUNTER — Ambulatory Visit (INDEPENDENT_AMBULATORY_CARE_PROVIDER_SITE_OTHER): Admitting: Women's Health

## 2024-02-17 VITALS — BP 120/80 | HR 89 | Ht 65.0 in | Wt 217.0 lb

## 2024-02-17 DIAGNOSIS — R87613 High grade squamous intraepithelial lesion on cytologic smear of cervix (HGSIL): Secondary | ICD-10-CM

## 2024-02-17 DIAGNOSIS — Z30011 Encounter for initial prescription of contraceptive pills: Secondary | ICD-10-CM

## 2024-02-17 DIAGNOSIS — Z3202 Encounter for pregnancy test, result negative: Secondary | ICD-10-CM | POA: Diagnosis not present

## 2024-02-17 LAB — POCT URINE PREGNANCY: Preg Test, Ur: NEGATIVE

## 2024-02-17 MED ORDER — SLYND 4 MG PO TABS: 1.0000 | ORAL_TABLET | Freq: Every day | ORAL | 3 refills | Status: DC

## 2024-02-17 NOTE — Progress Notes (Signed)
   COLPOSCOPY PROCEDURE NOTE Patient name: Kari Reilly MRN [id number]  Date of birth: 23-Feb-1996 Subjective Findings:   Kari Reilly is a 28 y.o. G0P0000 Caucasian female being seen today for a colposcopy. Wants birth control pills. Has migraines, does occ see floaters, but just had appt w/ neuro and they said w/o aura. However, pt gained a lot of weight w/ COCs in past, so wants to try POPs.   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  Immediate risk of CIN3+ is 53%. Discussed w/ Dr. Pricilla Holm, pt ok w/ colpo then referral to Dr. Pricilla Holm (ordered 4/1)     Patient's last menstrual period was 02/14/2024. Contraception: none. Menopausal: no. Hysterectomy: no.   Considering pregnancy: Yes  Smoker: no. Immunocompromised: no.   The risks and benefits were explained and informed consent was obtained, and written copy is in chart. Pertinent History Reviewed:   Reviewed past medical,surgical, social, obstetrical and family history.  Reviewed problem list, medications and allergies. Objective Findings & Procedure:   Vitals:   02/17/24 1457  BP: 120/80  Pulse: 89  Weight: 217 lb (98.4 kg)  Height: 5\' 5"  (1.651 m)  Body mass index is 36.11 kg/m.  Results for orders placed or performed in visit on 02/17/24 (from the past 24 hours)  POCT urine pregnancy   Collection Time: 02/17/24  4:00 PM  Result Value Ref Range   Preg Test, Ur Negative Negative     Time out was performed.  Speculum placed in the vagina, cervix fully visualized. SCJ: fully visualized. Cervix swabbed x 3 with acetic acid.  Acetowhitening present: Yes Cervix: no visible lesions, no mosaicism, no punctation, and no  abnormal vasculature. Circumferential ACW Endocervical curettage performed, Cervical biopsies taken at 6 & 12 o'clock, and Hemostasis achieved with Monsel's solution. Vagina: vaginal colposcopy not performed Vulva: vulvar colposcopy not performed  Specimens: 3  Complications: none  Chaperone: Jobe Marker  Colposcopic Impression & Plan:   Colposcopy findings consistent with HSIL Plan:  f/u w/ Dr. Pricilla Holm as planned after results back  Contraception management> rx Slynd (per pt preference) to [facility]  Return in about 6 months (around 08/18/2024) for pap.  Cheral Marker CNM, [facility] 02/17/2024 4:47 PM

## 2024-02-17 NOTE — Patient Instructions (Signed)
 Colposcopy, Care After  The following information offers guidance on how to care for yourself after your procedure. Your health care provider may also give you more specific instructions. If you have problems or questions, contact your health care provider. What can I expect after the procedure? If you had a colposcopy without a biopsy, you can expect to feel fine right away after your procedure. However, you may have some spotting of blood for a few days. You can return to your normal activities. If you had a colposcopy with a biopsy, it is common after the procedure to have: Soreness and mild pain. These may last for a few days. Mild vaginal bleeding or discharge that is dark-colored and grainy. This may last for a few days. The discharge may be caused by a liquid (solution) that was used during the procedure. You may need to wear a sanitary pad during this time. Spotting of blood for at least 48 hours after the procedure. Follow these instructions at home: Medicines Take over-the-counter and prescription medicines only as told by your health care provider. Talk with your health care provider about what type of over-the-counter pain medicines and prescription medicines you can start to take again. It is especially important to talk with your health care provider if you take blood thinners. Activity Avoid using douche products, using tampons, and having sex for at least 3 days after the procedure or for as long as told by your health care provider. Return to your normal activities as told by your health care provider. Ask your health care provider what activities are safe for you. General instructions Ask your health care provider if you may take baths, swim, or use a hot tub. You may take showers. If you use birth control (contraception), continue to use it. Keep all follow-up visits. This is important. Contact a health care provider if: You have a fever or chills. You faint or feel  light-headed. Get help right away if: You have heavy bleeding from your vagina or pass blood clots. Heavy bleeding is bleeding that soaks through a sanitary pad in less than 1 hour. You have vaginal discharge that is abnormal, is yellow in color, or smells bad. This could be a sign of infection. You have severe pain or cramps in your lower abdomen that do not go away with medicine. Summary If you had a colposcopy without a biopsy, you can expect to feel fine right away, but you may have some spotting of blood for a few days. You can return to your normal activities. If you had a colposcopy with a biopsy, it is common to have mild pain for a few days and spotting for 48 hours after the procedure. Avoid using douche products, using tampons, and having sex for at least 3 days after the procedure or for as long as told by your health care provider. Get help right away if you have heavy bleeding, severe pain, or signs of infection. This information is not intended to replace advice given to you by your health care provider. Make sure you discuss any questions you have with your health care provider. Document Revised: 03/30/2021 Document Reviewed: 03/30/2021 Elsevier Patient Education  2024 [employer].

## 2024-02-21 LAB — SURGICAL PATHOLOGY

## 2024-02-22 ENCOUNTER — Encounter: Payer: Self-pay | Admitting: Women's Health

## 2024-02-23 ENCOUNTER — Telehealth: Payer: Self-pay

## 2024-02-23 NOTE — Telephone Encounter (Signed)
 Spoke with Kari Reilly regarding her referral to GYN oncology. She has an appointment scheduled with Dr. Alvester Morin on 03/20/24 at 11:15. Patient agrees to date and time. She has been provided with office address and location. She is also aware of our mask and visitor policy. Patient verbalized understanding and will call with any questions.

## 2024-03-01 ENCOUNTER — Other Ambulatory Visit (HOSPITAL_COMMUNITY): Payer: Self-pay

## 2024-03-01 NOTE — Telephone Encounter (Signed)
 Pharmacy Patient Advocate Encounter  Received notification from [facility] that Prior Authorization for AIMOVIG 140MG  has been APPROVED from 3.2.25 to 4.1.26. Unable to obtain price due to refill too soon rejection, last fill date 4.3.25 next available fill date4.23.25   PA #/Case ID/Reference #: [id number]

## 2024-03-04 ENCOUNTER — Inpatient Hospital Stay: Admission: RE | Admit: 2024-03-04 | Source: Ambulatory Visit

## 2024-03-04 ENCOUNTER — Ambulatory Visit

## 2024-03-04 ENCOUNTER — Other Ambulatory Visit: Payer: Self-pay

## 2024-03-04 ENCOUNTER — Ambulatory Visit
Admission: EM | Admit: 2024-03-04 | Discharge: 2024-03-04 | Disposition: A | Discharge status: Home / Self Care | Attending: Physician Assistant | Admitting: Physician Assistant

## 2024-03-04 DIAGNOSIS — L249 Irritant contact dermatitis, unspecified cause: Secondary | ICD-10-CM | POA: Diagnosis not present

## 2024-03-04 LAB — POC COVID19/FLU A&B COMBO
Covid Antigen, POC: NEGATIVE
Influenza A Antigen, POC: NEGATIVE
Influenza B Antigen, POC: NEGATIVE

## 2024-03-04 MED ORDER — TRIAMCINOLONE ACETONIDE 0.1 % EX OINT: 1.0000 | TOPICAL_OINTMENT | Freq: Two times a day (BID) | CUTANEOUS | 0 refills | Status: AC

## 2024-03-04 NOTE — ED Provider Notes (Signed)
 Kari Reilly    CSN: [id number] Arrival date & time: 03/04/24  1319      History   Chief Complaint Chief Complaint  Patient presents with   Rash   Generalized Body Aches    HPI Kari Reilly is a 28 y.o. female.   HPI  She reports she is having a bad "chemical rash" under both arms - worse in R axilla vs L She states this has been present since Sunday and is very painful  She developed the rash after trying a new deodorant  Interventions: she has been applying Kenalog  cream to the area but this has not provided much relief     Past Medical History:  Diagnosis Date   Asthma    Headache    PCOS (polycystic ovarian syndrome)    POTS (postural orthostatic tachycardia syndrome)    Tachycardia    Vaginal Pap smear, abnormal     Patient Active Problem List   Diagnosis Date Noted   History of loop electrical excision procedure (LEEP) 02/15/2024   Abnormal Pap smear of cervix 02/07/2024   PCOS (polycystic ovarian syndrome) 02/07/2024   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 Authorizing Provider  triamcinolone  ointment (KENALOG ) 0.1 % Apply 1 Application topically 2 (two) times daily for 7 days. 03/04/24 03/11/24 Yes Tyreonna Czaplicki E, PA-C  Albuterol  Sulfate (PROAIR  RESPICLICK) 108 (90 Base) MCG/ACT AEPB Inhale 2 puffs into the lungs every 6 (six) hours as needed. Patient not taking: Reported on 02/07/2024 04/17/18   Buena Carmine, NP  Drospirenone   (SLYND ) 4 MG TABS Take 1 tablet (4 mg total) by mouth daily. 02/17/24   Ferd Householder, CNM  Erenumab -aooe (AIMOVIG ) 140 MG/ML SOAJ Inject 140 mg into the skin every 28 (twenty-eight) days. Patient not taking: Reported on 02/17/2024 02/09/24   Merriam Abbey, DO  guanFACINE (INTUNIV) 1 MG TB24 ER tablet Take 1 mg by mouth daily. 02/03/24   [provider]  ipratropium-albuterol  (DUONEB) 0.5-2.5 (3) MG/3ML SOLN Inhale 3 mLs into the lungs every 2 (two) hours as needed (shortness of breath). 08/15/20   [provider]  metFORMIN (GLUCOPHAGE) 500 MG tablet Take 500 mg by mouth daily.    [provider]  ondansetron  (ZOFRAN ) 4 MG tablet Take 1 tablet (4 mg total) by mouth 2 (two) times daily as needed. 02/09/24   Festus Hubert, Adam R, DO  pantoprazole (PROTONIX) 20 MG tablet Take 20 mg by mouth daily as needed for heartburn.    [provider]  ramelteon (ROZEREM) 8 MG tablet Take 8 mg by mouth at bedtime. 01/18/24   [provider]    Family History Family History  Problem Relation  Age of Onset   Anemia Mother     Social History Social History   Tobacco Use   Smoking status: Never   Smokeless tobacco: Never  Vaping Use   Vaping status: Never Used  Substance Use Topics   Alcohol use: Yes    Comment: occasional   Drug use: No     Allergies   Ciprofloxacin , Latex, Ciprofloxacin  hcl, Codeine, Magnesium  oxide -mg supplement, and Clarithromycin   Review of Systems Review of Systems   Physical Exam Triage Vital Signs ED Triage Vitals  Encounter Vitals Group     BP 03/04/24 1430 114/78     Systolic BP Percentile --      Diastolic BP Percentile --      Pulse Rate 03/04/24 1430 98     Resp 03/04/24 1430 18     Temp 03/04/24 1430 98.2 F (36.8 C)     Temp Source 03/04/24 1430 Oral     SpO2 03/04/24 1430 98 %     Weight 03/04/24 1431 205 lb (93 kg)     Height 03/04/24 1431 5\' 5"  (1.651 m)     Head Circumference --      Peak Flow --      Pain  Score 03/04/24 1431 7     Pain Loc --      Pain Education --      Exclude from Growth Chart --    No data found.  Updated Vital Signs BP 114/78 (BP Location: Right Arm)   Pulse 98   Temp 98.2 F (36.8 C) (Oral)   Resp 18   Ht 5\' 5"  (1.651 m)   Wt 205 lb (93 kg)   LMP 02/14/2024 (Exact Date)   SpO2 98%   BMI 34.11 kg/m   Visual Acuity Right Eye Distance:   Left Eye Distance:   Bilateral Distance:    Right Eye Near:   Left Eye Near:    Bilateral Near:     Physical Exam Vitals reviewed.  Constitutional:      General: She is awake.     Appearance: Normal appearance. She is well-developed and well-groomed.  HENT:     Head: Normocephalic and atraumatic.  Eyes:     Extraocular Movements: Extraocular movements intact.     Conjunctiva/sclera: Conjunctivae normal.     Pupils: Pupils are equal, round, and reactive to light.  Pulmonary:     Effort: Pulmonary effort is normal.  Musculoskeletal:     Cervical back: Normal range of motion.  Skin:    General: Skin is warm and dry.     Findings: Erythema and rash present.     Comments: Erythematous maculopapular rash to bilateral axilla with some areas of superficial ulceration and serous drainage No evidence of swelling, purulent drainage or crusting at this time    Neurological:     General: No focal deficit present.     Mental Status: She is alert and oriented to person, place, and time.  Psychiatric:        Mood and Affect: Mood normal.        Behavior: Behavior normal. Behavior is cooperative.        Thought Content: Thought content normal.        Judgment: Judgment normal.      Reilly Treatments / Results  Labs (all labs ordered are listed, but only abnormal results are displayed) Labs Reviewed  POC COVID19/FLU A&B COMBO    EKG   Radiology No results found.  Procedures  Procedures (including critical care time)  Medications Ordered in Reilly Medications - No data to display  Initial Impression / Assessment  and Plan / Reilly Course  I have reviewed the triage vital signs and the nursing notes.  Pertinent labs & imaging results that were available during my care of the patient were reviewed by me and considered in my medical decision making (see chart for details).      Final Clinical Impressions(s) / Reilly Diagnoses   Final diagnoses:  Acute irritant contact dermatitis   Patient presents today with concerns for contact dermatitis in bilateral axilla that started last Sunday.  She reports using a new deodorant and within 24 hours of application started to develop a rash that is painful and impacting her daily activity.  Physical exam reveals erythematous macular rash with mild serous drainage in some areas.  No signs of swelling, purulent drainage or streaking redness.  Physical exam appears consistent with contact dermatitis.  Recommend switching from Kenalog  cream to Kenalog  ointment.  Reviewed with patient that she should not use any topical steroid in this area for longer than 2 weeks and after 7 days she should stop using the ointment.  Patient voiced agreement understanding with recommendations.  Also recommend using occlusive moisturizer such as Aquaphor or Vaseline to further assist with skin barrier relief.  Recommend using a gentle cleanser such as Cetaphil and warm water to help keep the area clean and then patting to dry.  Recommend avoiding harsh cleansers or scrubbing as this can further irritate the skin. ED and return precautions reviewed and provided after visit summary.  Follow-up as needed. 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  She is requesting COVID testing today.  Rapid COVID and flu test were negative.  Results were discussed with patient during her appointment.     Discharge Instructions      Your flu and COVID test were negative. At this time it appears that you have an irritant contact dermatitis in your  underarm areas.  I recommend switching your steroid cream to Kenalog  ointment you can apply twice per day for 7 days.  It is not recommended to use a topical steroid such as Kenalog  in the same area such as the face or the underarms for more than 2 weeks so please do not use this for more than 7 days.  The ointment should provide increased moisturization and help rebuild the skin barrier little bit better than the cream In addition to the ointment you can also use Aquaphor or Vaseline per your preference to help soothe the skin and moisturize the area.  I recommend using a gentle cleanser such as Cetaphil and warm water to help keep the area clean and then pat to dry.  Please do not scrub the area or use a harsh cleanser as this could cause further damage to the skin and further discomfort. If at any point you start to develop significant swelling, drainage that looks like pus, honey colored crusts, more severe redness that is extending out past your armpits, fever or chills please return here or go to the emergency room for further evaluation as this could be signs of a significant skin infection.     ED Prescriptions     Medication Sig Dispense Auth. Provider   triamcinolone  ointment (KENALOG ) 0.1 % Apply 1 Application topically 2 (two) times daily for 7 days. 30 g Kashae Carstens E, PA-C  PDMP not reviewed this encounter.   Jerona Mooring, PA-C 03/04/24 1704

## 2024-03-04 NOTE — ED Triage Notes (Signed)
 Pt presents with complaints of rash under bilateral axilla x 6 days. Pt states her skin is sensitive and she used a new deodorant. Prescribed ointment applied to areas. Pt states her rash is becoming worse. Pt currently rates her pain a 7/10.   Pt is also requesting COVID testing today. 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 Unsure of fevers at home.

## 2024-03-04 NOTE — Discharge Instructions (Addendum)
 Your flu and COVID test were negative. At this time it appears that you have an irritant contact dermatitis in your underarm areas.  I recommend switching your steroid cream to Kenalog  ointment you can apply twice per day for 7 days.  It is not recommended to use a topical steroid such as Kenalog  in the same area such as the face or the underarms for more than 2 weeks so please do not use this for more than 7 days.  The ointment should provide increased moisturization and help rebuild the skin barrier little bit better than the cream In addition to the ointment you can also use Aquaphor or Vaseline per your preference to help soothe the skin and moisturize the area.  I recommend using a gentle cleanser such as Cetaphil and warm water to help keep the area clean and then pat to dry.  Please do not scrub the area or use a harsh cleanser as this could cause further damage to the skin and further discomfort. If at any point you start to develop significant swelling, drainage that looks like pus, honey colored crusts, more severe redness that is extending out past your armpits, fever or chills please return here or go to the emergency room for further evaluation as this could be signs of a significant skin infection.

## 2024-03-07 ENCOUNTER — Encounter: Admitting: Women's Health

## 2024-03-09 ENCOUNTER — Ambulatory Visit
Admission: RE | Admit: 2024-03-09 | Discharge: 2024-03-09 | Disposition: A | Discharge status: Home / Self Care | Source: Ambulatory Visit

## 2024-03-09 VITALS — BP 96/70 | HR 89 | Temp 98.5°F | Resp 16

## 2024-03-09 DIAGNOSIS — L03111 Cellulitis of right axilla: Secondary | ICD-10-CM | POA: Diagnosis not present

## 2024-03-09 DIAGNOSIS — L03112 Cellulitis of left axilla: Secondary | ICD-10-CM

## 2024-03-09 DIAGNOSIS — R21 Rash and other nonspecific skin eruption: Secondary | ICD-10-CM | POA: Diagnosis not present

## 2024-03-09 MED ORDER — DOXYCYCLINE HYCLATE 100 MG PO CAPS: 100.0000 mg | ORAL_CAPSULE | Freq: Two times a day (BID) | ORAL | 0 refills | Status: DC

## 2024-03-09 MED ORDER — PREDNISONE 10 MG (21) PO TBPK: ORAL_TABLET | Freq: Every day | ORAL | 0 refills | Status: DC

## 2024-03-09 NOTE — Discharge Instructions (Addendum)
 Instructed patient to discontinue Kenalog  ointment.  Advised patient to take medications as directed with food to completion.  Advised patient to take Sterapred Unipak (prednisone  with first dose of doxycycline  until complete.  Encouraged increase daily water intake to 64 ounces per day while taking these medications.  Advised if symptoms worsen and/or unresolved please follow-up with your PCP or dermatology for further evaluation.

## 2024-03-09 NOTE — ED Provider Notes (Signed)
 Kari Reilly    CSN: [id number] Arrival date & time: 03/09/24  1308      History   Chief Complaint Chief Complaint  Patient presents with   Rash    Entered by patient    HPI Kari Reilly is a 28 y.o. female.   HPI 28 year old female presents with rash of bilateral axilla.  Patient reports was evaluated by [facility] on 03/04/2024 please see epic for that encounter note.  Patient reports previous treatment provided no improvement.  PMH significant for obesity, PCOS, and asthma.  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 Authorizing Provider  doxycycline  (VIBRAMYCIN ) 100 MG capsule Take 1 capsule (100 mg total) by mouth 2 (two) times daily for 7 days. 03/09/24 03/16/24 Yes Kari Ramp, FNP  predniSONE  (STERAPRED UNI-PAK 21 TAB) 10 MG (21) TBPK tablet Take by mouth daily. Take 6 tabs by mouth daily  for 2 days, then 5 tabs for 2 days, then 4 tabs for 2 days, then 3 tabs for 2 days, 2 tabs for 2 days, then 1 tab by mouth daily for 2 days 03/09/24  Yes Kari Ramp, FNP  ZEPBOUND 2.5 MG/0.5ML Pen Inject 2.5 mg into the skin once a week. 02/17/24  Yes [provider]  Albuterol  Sulfate (PROAIR  RESPICLICK) 108 (90 Base) MCG/ACT AEPB Inhale 2 puffs into the lungs every 6 (six) hours as needed. Patient not taking: Reported on 02/07/2024 04/17/18   Buena Carmine, NP  Drospirenone  (SLYND ) 4 MG TABS Take 1 tablet (4 mg total) by mouth daily. 02/17/24   Ferd Householder, CNM  Erenumab -aooe (AIMOVIG ) 140 MG/ML SOAJ Inject 140 mg into the skin every 28 (twenty-eight) days. 02/09/24   Merriam Abbey, DO  guanFACINE (INTUNIV) 1 MG TB24 ER tablet Take 1 mg by mouth daily. 02/03/24   [provider]  ipratropium-albuterol  (DUONEB) 0.5-2.5 (3) MG/3ML SOLN Inhale 3 mLs into the lungs every 2 (two) hours as needed (shortness of breath). 08/15/20   [provider]  metFORMIN (GLUCOPHAGE) 500 MG tablet Take 500 mg by mouth daily.    [provider]  ondansetron  (ZOFRAN ) 4 MG tablet Take 1 tablet (4 mg total) by mouth 2 (two) times daily as needed.  02/09/24   Festus Hubert, Adam R, DO  pantoprazole (PROTONIX) 20 MG tablet Take 20 mg by mouth daily as needed for heartburn.    [provider]  ramelteon (ROZEREM) 8 MG tablet Take 8 mg by mouth at bedtime. 01/18/24   [provider]  triamcinolone  ointment (KENALOG ) 0.1 % Apply 1 Application topically 2 (two) times daily for 7 days. 03/04/24 03/11/24  Mecum, Pearla Bottom, PA-C    Family History Family History  Problem Relation Age of Onset   Anemia Mother     Social History Social History   Tobacco Use   Smoking status: Never   Smokeless tobacco: Never  Vaping Use   Vaping status: Never Used  Substance Use Topics   Alcohol use: Yes    Comment: occasional   Drug use: No     Allergies   Ciprofloxacin , Latex, Ciprofloxacin  hcl, Codeine, Magnesium  oxide -mg supplement, and Clarithromycin   Review of Systems Review of Systems  Skin:  Positive for rash.     Physical  Exam Triage Vital Signs ED Triage Vitals  Encounter Vitals Group     BP      Systolic BP Percentile      Diastolic BP Percentile      Pulse      Resp      Temp      Temp src      SpO2      Weight      Height      Head Circumference      Peak Flow      Pain Score      Pain Loc      Pain Education      Exclude from Growth Chart    No data found.  Updated Vital Signs BP 96/70 (BP Location: Right Arm)   Pulse 89   Temp 98.5 F (36.9 C) (Oral)   Resp 16   LMP 02/14/2024 (Exact Date)   SpO2 98%      Physical Exam Vitals and nursing note reviewed.  Constitutional:      Appearance: Normal appearance. She is normal weight.  HENT:     Head: Normocephalic and atraumatic.     Mouth/Throat:     Mouth: Mucous membranes are moist.     Pharynx: Oropharynx is clear.  Eyes:     Extraocular Movements: Extraocular movements intact.     Conjunctiva/sclera: Conjunctivae normal.     Pupils: Pupils are equal, round, and reactive to light.  Cardiovascular:     Rate and Rhythm: Normal rate and regular rhythm.     Pulses: Normal pulses.     Heart sounds: Normal heart sounds.  Pulmonary:     Effort: Pulmonary effort is normal.     Breath sounds: Normal breath sounds. No wheezing, rhonchi or rales.  Chest:     Chest wall: No tenderness.  Musculoskeletal:        General: Normal range of motion.  Skin:    General: Skin is warm and dry.     Comments: Bilateral axilla: Pruritic erythematous maculopapular eruption please see images below  Neurological:     General: No focal deficit present.     Mental Status: She is alert and oriented to person, place, and time. Mental status is at baseline.  Psychiatric:        Mood and Affect: Mood normal.        Behavior: Behavior normal.        Thought Content: Thought content normal.  UC Treatments / Results  Labs (all labs ordered are listed, but only abnormal results are displayed) Labs Reviewed - No data to  display  EKG   Radiology No results found.  Procedures Procedures (including critical Reilly time)  Medications Ordered in UC Medications - No data to display  Initial Impression / Assessment and Plan / UC Course  I have reviewed the triage vital signs and the nursing notes.  Pertinent labs & imaging results that were available during my Reilly of the patient were reviewed by me and considered in my medical decision making (see chart for details).     MDM: 1.  Rash and nonspecific skin eruption-Rx Sterapred Unipak 21 tab 10 mg; 2.  Cellulitis of axilla, left-Rx'd doxycycline  100 mg capsule: Take 1 capsule twice daily x 7 days; 3.  Cellulitis of axilla, right-Rx'd doxycycline  100 mg capsule: Take 1 capsule twice daily x 7 days. Instructed patient to discontinue Kenalog  ointment.  Advised patient to take medications as directed with food to completion.  Advised patient to take Sterapred Unipak (prednisone  with first dose of doxycycline  until complete.  Encouraged increase daily water intake to 64 ounces per day while taking these medications.  Advised if symptoms worsen and/or unresolved please follow-up with your PCP or dermatology for further evaluation.  Patient discharged home, hemodynamically stable. Final Clinical Impressions(s) / UC Diagnoses   Final diagnoses:  Rash and nonspecific skin eruption  Cellulitis of axilla, left  Cellulitis of axilla, right     Discharge Instructions      Instructed patient to discontinue Kenalog  ointment.  Advised patient to take medications as directed with food to completion.  Advised patient to take Sterapred Unipak (prednisone  with first dose of doxycycline  until complete.  Encouraged increase daily water intake to 64 ounces per day while taking these medications.  Advised if symptoms worsen and/or unresolved please follow-up with your PCP or dermatology for further evaluation.     ED Prescriptions     Medication Sig Dispense Auth. Provider    predniSONE  (STERAPRED UNI-PAK 21 TAB) 10 MG (21) TBPK tablet Take by mouth daily. Take 6 tabs by mouth daily  for 2 days, then 5 tabs for 2 days, then 4 tabs for 2 days, then 3 tabs for 2 days, 2 tabs for 2 days, then 1 tab by mouth daily for 2 days 42 tablet Kari Ramp, FNP   doxycycline  (VIBRAMYCIN ) 100 MG capsule Take 1 capsule (100 mg total) by mouth 2 (two) times daily for 7 days. 14 capsule Kari Herbst, FNP      PDMP not reviewed this encounter.   Kari Ramp, FNP 03/09/24 1354

## 2024-03-09 NOTE — ED Triage Notes (Signed)
 Pt presents with a rash under both axilla. States was seen at [facility] on 4/19 and rash is getting worse. States have been using Kenalog  ointment twice a day and Aquafor with no improvement.

## 2024-03-14 ENCOUNTER — Ambulatory Visit: Payer: No Typology Code available for payment source | Admitting: Neurology

## 2024-03-15 ENCOUNTER — Other Ambulatory Visit: Payer: Self-pay | Admitting: Women's Health

## 2024-03-15 ENCOUNTER — Encounter: Payer: Self-pay | Admitting: Women's Health

## 2024-03-15 MED ORDER — FLUCONAZOLE 150 MG PO TABS: 150.0000 mg | ORAL_TABLET | Freq: Once | ORAL | 0 refills | Status: AC

## 2024-03-16 ENCOUNTER — Encounter: Payer: Self-pay | Admitting: Psychiatry

## 2024-03-20 ENCOUNTER — Inpatient Hospital Stay: Attending: Psychiatry | Admitting: Psychiatry

## 2024-03-20 ENCOUNTER — Encounter: Payer: Self-pay | Admitting: Psychiatry

## 2024-03-20 ENCOUNTER — Inpatient Hospital Stay (HOSPITAL_BASED_OUTPATIENT_CLINIC_OR_DEPARTMENT_OTHER): Admitting: Gynecologic Oncology

## 2024-03-20 VITALS — BP 124/85 | HR 92 | Temp 99.0°F | Resp 18 | Ht 64.0 in | Wt 207.0 lb

## 2024-03-20 DIAGNOSIS — D069 Carcinoma in situ of cervix, unspecified: Secondary | ICD-10-CM | POA: Insufficient documentation

## 2024-03-20 DIAGNOSIS — Z91040 Latex allergy status: Secondary | ICD-10-CM | POA: Diagnosis not present

## 2024-03-20 DIAGNOSIS — Z803 Family history of malignant neoplasm of breast: Secondary | ICD-10-CM | POA: Diagnosis not present

## 2024-03-20 DIAGNOSIS — Z808 Family history of malignant neoplasm of other organs or systems: Secondary | ICD-10-CM | POA: Diagnosis not present

## 2024-03-20 DIAGNOSIS — E282 Polycystic ovarian syndrome: Secondary | ICD-10-CM | POA: Diagnosis not present

## 2024-03-20 DIAGNOSIS — Z7984 Long term (current) use of oral hypoglycemic drugs: Secondary | ICD-10-CM | POA: Insufficient documentation

## 2024-03-20 DIAGNOSIS — Z800 Family history of malignant neoplasm of digestive organs: Secondary | ICD-10-CM | POA: Diagnosis not present

## 2024-03-20 DIAGNOSIS — Z79899 Other long term (current) drug therapy: Secondary | ICD-10-CM | POA: Insufficient documentation

## 2024-03-20 DIAGNOSIS — G90A Postural orthostatic tachycardia syndrome (POTS): Secondary | ICD-10-CM | POA: Diagnosis not present

## 2024-03-20 DIAGNOSIS — Z885 Allergy status to narcotic agent status: Secondary | ICD-10-CM | POA: Insufficient documentation

## 2024-03-20 DIAGNOSIS — J45909 Unspecified asthma, uncomplicated: Secondary | ICD-10-CM | POA: Diagnosis not present

## 2024-03-20 DIAGNOSIS — Z881 Allergy status to other antibiotic agents status: Secondary | ICD-10-CM | POA: Insufficient documentation

## 2024-03-20 MED ORDER — TRAMADOL HCL 50 MG PO TABS
50.0000 mg | ORAL_TABLET | Freq: Four times a day (QID) | ORAL | 0 refills | Status: AC | PRN
Start: 2024-03-20 — End: ?

## 2024-03-20 MED ORDER — SENNOSIDES-DOCUSATE SODIUM 8.6-50 MG PO TABS
2.0000 | ORAL_TABLET | Freq: Every day | ORAL | 0 refills | Status: AC
Start: 2024-03-20 — End: ?

## 2024-03-20 NOTE — Patient Instructions (Addendum)
 Preparing for your Surgery  Plan for surgery on 04/18/2024 with Dr. Derrel Flies at [facility]. You will be scheduled for pelvic examination under anesthesia, cold knife conization of the cervix (cone shaped biopsy of the cervix), endocervical curettage (sampling from the inner canal of the cervix).   Pre-operative Testing -You will receive a phone call from presurgical testing at [facility] to discuss surgery instructions and arrange for lab work if needed.  -Bring your insurance card, copy of an advanced directive if applicable, medication list.  -You should not be taking blood thinners or aspirin at least ten days prior to surgery unless instructed by your surgeon.  -Do not take supplements such as fish oil (omega 3), red yeast rice, turmeric before your surgery. You want to avoid medications with aspirin in them including headache powders such as BC or Goody's), Excedrin migraine.  Day Before Surgery at Home -You will be advised you can have clear liquids up until 3 hours before your surgery.    Your role in recovery Your role is to become active as soon as directed by your doctor, while still giving yourself time to heal.  Rest when you feel tired. You will be asked to do the following in order to speed your recovery:  - Cough and breathe deeply. This helps to clear and expand your lungs and can prevent pneumonia after surgery.  - STAY ACTIVE WHEN YOU GET HOME. Do mild physical activity. Walking or moving your legs help your circulation and body functions return to normal. Do not try to get up or walk alone the first time after surgery.   -If you develop swelling on one leg or the other, pain in the back of your leg, redness/warmth in one of your legs, please call the office or go to the Emergency Room to have a doppler to rule out a blood clot. For shortness of breath, chest pain-seek care in the Emergency Room as soon as possible. - Actively manage your pain.  Managing your pain lets you move in comfort. We will ask you to rate your pain on a scale of zero to 10. It is your responsibility to tell your doctor or nurse where and how much you hurt so your pain can be treated.  Special Considerations -Your final pathology results from surgery should be available around one week after surgery and the results will be relayed to you when available.  -FMLA forms can be faxed to [phone] and please allow 5-7 business days for completion.  Pain Management After Surgery -You will be prescribed your pain medication and bowel regimen medications before surgery so that you can have these available when you are discharged from the hospital. The pain medication is for use ONLY AFTER surgery and a new prescription will not be given.   -Make sure that you have Tylenol  and Ibuprofen  at home IF YOU ARE ABLE TO TAKE THESE MEDICATIONS to use on a regular basis after surgery for pain control. We recommend alternating the medications every hour to six hours since they work differently and are processed in the body differently for pain relief.  -Review the attached handout on narcotic use and their risks and side effects.   Bowel Regimen -You will be prescribed Sennakot-S to take nightly to prevent constipation especially if you are taking the narcotic pain medication intermittently.  It is important to prevent constipation and drink adequate amounts of liquids. You can stop taking this medication when you are not taking  pain medication and you are back on your normal bowel routine.  Risks of Surgery Risks of surgery are low but include bleeding, infection, damage to surrounding structures, re-operation, blood clots, and very rarely death.  AFTER SURGERY INSTRUCTIONS   Return to work: 1-2 days after surgery (Thursday return after Tuesday procedure)   You may notice a piece of gauze like-material come out of the vagina over the next several days. This is NORMAL and is  used during surgery to help stop bleeding at the cervix.    Activity: 1. Be up and out of the bed during the day.  Take a nap if needed.  You may walk up steps but be careful and use the hand rail.  Stair climbing will tire you more than you think, you may need to stop part way and rest.    2. No lifting or straining for 4 weeks over 10 pounds. No pushing, pulling, straining for 4 weeks.   3. No driving for minimum 24 hours after surgery but usually it is longer when the following criteria have been met: Do not drive if you are taking narcotic pain medicine and make sure that your reaction time has returned.    4. You can shower as soon as the next day after surgery. Shower daily. No tub baths or submerging your body in water until cleared by your surgeon. If you have the soap that was given to you by pre-surgical testing that was used before surgery, you do not need to use it afterwards because this can irritate your incisions.    5. No sexual activity and nothing in the vagina for 4-6 weeks, until seen in the office.   6. You may experience vaginal spotting and discharge after surgery.  The spotting is normal but if you experience heavy bleeding, call our office.   7. Take Tylenol  or ibuprofen  first for pain if you are able to take these medications and only use narcotic pain medication for severe pain not relieved by the Tylenol  or Ibuprofen .  Monitor your Tylenol  intake to a max of 4,000 mg in a 24 hour period. You can alternate these medications after surgery.   Diet: 1. Low sodium Heart Healthy Diet is recommended but you are cleared to resume your normal (before surgery) diet after your procedure.   2. It is safe to use a laxative, such as Miralax or Colace, if you have difficulty moving your bowels. You will be prescribed Sennakot at bedtime every evening to keep bowel movements regular and to prevent constipation.     Wound Care: 1. Keep clean and dry.  Shower daily.   Reasons to  call the Doctor: Fever - Oral temperature greater than 100.4 degrees Fahrenheit Foul-smelling vaginal discharge Difficulty urinating Nausea and vomiting Increased pain at the site of the incision that is unrelieved with pain medicine. Difficulty breathing with or without chest pain New calf pain especially if only on one side Sudden, continuing increased vaginal bleeding with or without clots.   Contacts: For questions or concerns you should contact:   Dr. Derrel Flies at [phone]   Vira Grieves, NP at [phone]   After Hours: call [phone] and have the GYN Oncologist paged/contacted (after 5 pm or on the weekends).   Messages sent via mychart are for non-urgent matters and are not responded to after hours so for urgent needs, please call the after hours number.

## 2024-03-20 NOTE — Progress Notes (Signed)
 GYNECOLOGIC ONCOLOGY NEW PATIENT CONSULTATION  Date of Service: 03/20/2024 Referring Provider: Glinda Lapping, CNM   ASSESSMENT AND PLAN: Kari Reilly is a 28 y.o. 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  We reviewed the nature of cervical dysplasia.  Reviewed standard of care treatment with excisional procedure.  With questions regarding possible alternative treatments or observation.  Discussed findings from exam today and prior pathology results with positive endocervical margins and ECC.  Discussed that laser would not be sufficient to treat the endocervical area.  Additionally discussed topical treatment with 5-FU.  Reviewed that this has been studied in CIN 2 but would not be within standard of care for CIN-3.  However, I would favor this over observation if patient were to decline a repeat excisional procedure.  Also, patient with future fertility desires.  We reviewed the obstetrical risk of a second excisional procedure, primarily increased risk of preterm delivery.  Following discussion as above, patient is amenable to excisional procedure.  Patient was consented for: Cold knife conization, endocervical curettage on 04/18/2024.  The risks of surgery were discussed in detail and she understands these to including but not limited to bleeding requiring a blood transfusion, infection, injury to adjacent organs (including but not limited to the bowels, bladder, ureters, nerves, blood vessels), thromboembolic events, unforseen complication, and possible need for re-exploration.  If the patient experiences any of these events, she understands that her hospitalization or recovery may be prolonged and that she may need to take additional medications for a prolonged period. The patient will receive DVT and antibiotic prophylaxis as indicated. She voiced a clear understanding. She had the opportunity to ask  questions and informed consent was obtained today. She wishes to proceed.  She does not require preoperative clearance. Her METs are >4.  All preoperative instructions were reviewed. Postoperative expectations were also reviewed. Written handouts were provided to the patient.   A copy of this note was sent to the patient's referring provider.  Derrel Flies, MD Gynecologic Oncology   Medical Decision Making I personally spent  TOTAL 75 minutes face-to-face and non-face-to-face in the care of this patient, which includes all pre, intra, and post visit time on the date of service.   ------------  CC: CIN3  HISTORY OF PRESENT ILLNESS:  Kari Reilly is a 28 y.o. woman who is seen in consultation at the request of Glinda Lapping, CNM for evaluation of CIN.  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  She subsequently underwent a colposcopy on 02/17/2024 which showed CIN 2 at 6:00 and 12:00 biopsies, and CIN-3 on ECC.  Presents today with her mom.  Mother has a history of melanoma.  They report she was told that this is not likely a genetic condition and so the patient was not recommended for germline testing.    Otherwise patient reports that she has monthly periods and denies any intermenstrual spotting or postcoital bleeding.  Had all 3 HPV vaccines.  She does not believe she had abnormal Pap smears prior to 2023.  Her treatment from 2023 to December 2024 [facility]  [facility]  She reports a negative experience with her prior LEEP with inadequate pain control during the procedure and concerns for infection postprocedure.  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   TREATMENT HISTORY: 2023: HSIL pap, copo CIN2-3, ECC neg 08/27/22: LEEP CIN2-3, endo margins+ 02/2023  LSIL 03/15/23: colpo CIN1, neg ECC 10/06/2023: Pap HSIL 10/18/2023: Colpo CIN2-3 > offered referral to [facility] but out of network   PAST MEDICAL HISTORY: Past Medical History:  Diagnosis Date   Asthma    Headache    Migraine    PCOS (polycystic ovarian syndrome)    POTS (postural orthostatic tachycardia syndrome)    Tachycardia    Vaginal Pap smear, abnormal     PAST SURGICAL HISTORY: Past Surgical History:  Procedure Laterality Date   ADENOIDECTOMY     IR ANGIOGRAM FOLLOW UP STUDY     LASIK Bilateral    LEEP  08/2022   TYMPANOSTOMY TUBE PLACEMENT      OB/GYN HISTORY: OB History  Gravida Para Term Preterm AB Living  0 0 0 0 0 0  SAB IAB Ectopic Multiple Live Births  0 0 0 0 0      Age at menarche: 57 Age at menopause: n/a Hx of HRT: previously IUD, pills, planning to stop pills Hx of STI: no Last pap: see above History of abnormal pap smears: see above  SCREENING STUDIES:  Last mammogram: n/a Last colonoscopy: n/a  MEDICATIONS:  Current Outpatient Medications:    Albuterol  Sulfate (PROAIR  RESPICLICK) 108 (90 Base) MCG/ACT AEPB, Inhale 2 puffs into the lungs every 6 (six) hours as needed., Disp: 1 each, Rfl: 0   cyclobenzaprine  (FLEXERIL ) 5 MG tablet, Take 5-10 mg by mouth., Disp: , Rfl:    Erenumab -aooe (AIMOVIG ) 140 MG/ML SOAJ, Inject 140 mg into the skin every 28 (twenty-eight) days., Disp: 1.12 mL, Rfl: 5   ipratropium-albuterol  (DUONEB) 0.5-2.5 (3) MG/3ML SOLN, Inhale 3 mLs into the lungs every 2 (two) hours as needed (shortness of breath)., Disp: , Rfl:    metFORMIN (GLUCOPHAGE) 500 MG tablet, Take 500 mg by mouth daily., Disp: , Rfl:    ondansetron  (ZOFRAN ) 4 MG tablet, Take 1 tablet (4 mg total) by mouth 2 (two) times daily as needed., Disp: 20 tablet, Rfl: 5   pantoprazole (PROTONIX) 20 MG tablet, Take 20 mg by mouth daily as needed for heartburn., Disp: , Rfl:    ramelteon (ROZEREM) 8 MG tablet, Take 8 mg by mouth at bedtime., Disp: , Rfl:    tiZANidine  (ZANAFLEX) 4 MG tablet, , Disp: , Rfl:   ALLERGIES: Allergies  Allergen Reactions   Ciprofloxacin  Hives and Rash   Latex Rash   Ciprofloxacin  Hcl Hives   Codeine Other (See Comments)    "acts crazy"   Magnesium  Oxide -Mg Supplement     Hives and itching on magnesium  oxide   Clarithromycin Rash    FAMILY HISTORY: Family History  Problem Relation Age of Onset   Anemia Mother    Melanoma Mother    Breast cancer Maternal Grandmother    Colon cancer Paternal Grandfather    Prostate cancer Neg Hx    Pancreatic cancer Neg Hx    Ovarian cancer Neg Hx    Endometrial cancer Neg Hx     SOCIAL HISTORY: Social History   Socioeconomic History   Marital status: Divorced    Spouse name: Not on file   Number of children: 0   Years of education: 16   Highest education level: Not on file  Occupational History   Not on file  Tobacco Use   Smoking status: Never  Smokeless tobacco: Never  Vaping Use   Vaping status: Never Used  Substance and Sexual Activity   Alcohol use: Not Currently    Comment: occasional   Drug use: No   Sexual activity: Yes    Birth control/protection: None  Other Topics Concern   Not on file  Social History Narrative   Right handed   Lives with alone   Currently working   One floor home   Drinks caffeine prn   Social Drivers of Health   Financial Resource Strain: Low Risk  (02/07/2024)   Overall Financial Resource Strain (CARDIA)    Difficulty of Paying Living Expenses: Not hard at all  Food Insecurity: No Food Insecurity (02/07/2024)   Hunger Vital Sign    Worried About Running Out of Food in the Last Year: Never true    Ran Out of Food in the Last Year: Never true  Transportation Needs: No Transportation Needs (02/07/2024)   PRAPARE - Administrator, Civil Service (Medical): No    Lack of Transportation (Non-Medical): No  Physical Activity: Insufficiently Active (02/07/2024)   Exercise Vital Sign    Days of Exercise per Week: 3 days     Minutes of Exercise per Session: 40 min  Stress: Stress Concern Present (02/07/2024)   [employer] of Occupational Health - Occupational Stress Questionnaire    Feeling of Stress : Rather much  Social Connections: Socially Isolated (02/07/2024)   Social Connection and Isolation Panel [NHANES]    Frequency of Communication with Friends and Family: More than three times a week    Frequency of Social Gatherings with Friends and Family: Once a week    Attends Religious Services: Never    Database administrator or Organizations: No    Attends Banker Meetings: Never    Marital Status: Divorced  Catering manager Violence: Not At Risk (02/07/2024)   Humiliation, Afraid, Rape, and Kick questionnaire    Fear of Current or Ex-Partner: No    Emotionally Abused: No    Physically Abused: No    Sexually Abused: No    REVIEW OF SYSTEMS: New patient intake form was reviewed.  Complete 10-system review is negative except for the following: none  PHYSICAL EXAM: BP 124/85 (BP Location: Right Arm, Patient Position: Sitting)   Pulse 92   Temp 99 F (37.2 C) (Oral)   Resp 18   Ht 5\' 4"  (1.626 m)   Wt 207 lb (93.9 kg)   LMP 02/14/2024 (Exact Date)   SpO2 100%   BMI 35.53 kg/m  Constitutional: No acute distress. Neuro/Psych: Alert, oriented.  Head and Neck: Normocephalic, atraumatic. Neck symmetric without masses. Sclera anicteric.  Respiratory: Normal work of breathing. Clear to auscultation bilaterally. Cardiovascular: Regular rate and rhythm, no murmurs, rubs, or gallops. Abdomen: Normoactive bowel sounds. Soft, non-distended, non-tender to palpation.  Extremities: Grossly normal range of motion. Warm, well perfused. No edema bilaterally. Skin: No rashes or lesions. Lymphatic: No  inguinal adenopathy. Genitourinary: External genitalia without lesions. Urethral meatus without lesions or prolapse. On speculum exam, vagina and cervix without lesions. Bimanual exam reveals  normal cervix, mobile uterus. Exam chaperoned by Vira Grieves, NP  COLPOSCOPY PROCEDURE NOTE  Procedure Details: After appropriate verbal informed consent was obtained, a timeout was performed. A sterile speculum was placed in the vagina. Acetic acid was applied to the cervix, and the cervix was inspected with the colposcope with the findings as noted below. The speculum was removed from the vagina. The patient  tolerated the procedure well.   Adequate Exam: no transformation zone visualized  Biopsy Specimen: none  Condition: Stable. Patient tolerated procedure well.  Complications: None  Findings: small area of acetowhite changes at 12 o'clock and within the external cervical os   Colposcopic Impression: CIN2    LABORATORY AND RADIOLOGIC DATA: Outside medical records were reviewed to synthesize the above history, along with the history and physical obtained during the visit.  Outside laboratory, pathology reports were reviewed, with pertinent results below.    WBC  Date Value Ref Range Status  12/09/2019 8.4 4.0 - 10.5 K/uL Final   Hemoglobin  Date Value Ref Range Status  12/09/2019 13.3 12.0 - 15.0 g/dL Final   HCT  Date Value Ref Range Status  12/09/2019 41.4 36.0 - 46.0 % Final   Platelets  Date Value Ref Range Status  12/09/2019 300 150 - 400 K/uL Final   Creatinine, Ser  Date Value Ref Range Status  12/09/2019 0.75 0.44 - 1.00 mg/dL Final   AST  Date Value Ref Range Status  10/07/2014 18 0 - 37 U/L Final   ALT  Date Value Ref Range Status  10/07/2014 16 0 - 35 U/L Final   Diagnosis  Date Value Ref Range Status  02/07/2024 (A)  Final   - High grade squamous intraepithelial lesion (HSIL)   Surgical pathology (02/17/24): A. ENDOCERVIX, CURETTAGE:       High-grade squamous intraepithelial lesion (HSIL / CIN3).       Endocervical glands without significant diagnostic alteration.   B. CERVIX, 12 O'CLOCK, BIOPSY:       High-grade squamous intraepithelial  lesion (HSIL / CIN2).   C. CERVIX, 6 O'CLOCK, BIOPSY:       High-grade squamous intraepithelial lesion (HSIL / CIN2)

## 2024-03-20 NOTE — H&P (View-Only) (Signed)
 GYNECOLOGIC ONCOLOGY NEW PATIENT CONSULTATION  Date of Service: 03/20/2024 Referring Provider: Glinda Lapping, CNM   ASSESSMENT AND PLAN: Kari Reilly is a 28 y.o. 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  We reviewed the nature of cervical dysplasia.  Reviewed standard of care treatment with excisional procedure.  With questions regarding possible alternative treatments or observation.  Discussed findings from exam today and prior pathology results with positive endocervical margins and ECC.  Discussed that laser would not be sufficient to treat the endocervical area.  Additionally discussed topical treatment with 5-FU.  Reviewed that this has been studied in CIN 2 but would not be within standard of care for CIN-3.  However, I would favor this over observation if patient were to decline a repeat excisional procedure.  Also, patient with future fertility desires.  We reviewed the obstetrical risk of a second excisional procedure, primarily increased risk of preterm delivery.  Following discussion as above, patient is amenable to excisional procedure.  Patient was consented for: Cold knife conization, endocervical curettage on 04/18/2024.  The risks of surgery were discussed in detail and she understands these to including but not limited to bleeding requiring a blood transfusion, infection, injury to adjacent organs (including but not limited to the bowels, bladder, ureters, nerves, blood vessels), thromboembolic events, unforseen complication, and possible need for re-exploration.  If the patient experiences any of these events, she understands that her hospitalization or recovery may be prolonged and that she may need to take additional medications for a prolonged period. The patient will receive DVT and antibiotic prophylaxis as indicated. She voiced a clear understanding. She had the opportunity to ask  questions and informed consent was obtained today. She wishes to proceed.  She does not require preoperative clearance. Her METs are >4.  All preoperative instructions were reviewed. Postoperative expectations were also reviewed. Written handouts were provided to the patient.   A copy of this note was sent to the patient's referring provider.  Derrel Flies, MD Gynecologic Oncology   Medical Decision Making I personally spent  TOTAL 75 minutes face-to-face and non-face-to-face in the care of this patient, which includes all pre, intra, and post visit time on the date of service.   ------------  CC: CIN3  HISTORY OF PRESENT ILLNESS:  Kari Reilly is a 28 y.o. woman who is seen in consultation at the request of Glinda Lapping, CNM for evaluation of CIN.  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  She subsequently underwent a colposcopy on 02/17/2024 which showed CIN 2 at 6:00 and 12:00 biopsies, and CIN-3 on ECC.  Presents today with her mom.  Mother has a history of melanoma.  They report she was told that this is not likely a genetic condition and so the patient was not recommended for germline testing.    Otherwise patient reports that she has monthly periods and denies any intermenstrual spotting or postcoital bleeding.  Had all 3 HPV vaccines.  She does not believe she had abnormal Pap smears prior to 2023.  Her treatment from 2023 to December 2024 [facility]  [facility]  She reports a negative experience with her prior LEEP with inadequate pain control during the procedure and concerns for infection postprocedure.  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   TREATMENT HISTORY: 2023: HSIL pap, copo CIN2-3, ECC neg 08/27/22: LEEP CIN2-3, endo margins+ 02/2023  LSIL 03/15/23: colpo CIN1, neg ECC 10/06/2023: Pap HSIL 10/18/2023: Colpo CIN2-3 > offered referral to [facility] but out of network   PAST MEDICAL HISTORY: Past Medical History:  Diagnosis Date   Asthma    Headache    Migraine    PCOS (polycystic ovarian syndrome)    POTS (postural orthostatic tachycardia syndrome)    Tachycardia    Vaginal Pap smear, abnormal     PAST SURGICAL HISTORY: Past Surgical History:  Procedure Laterality Date   ADENOIDECTOMY     IR ANGIOGRAM FOLLOW UP STUDY     LASIK Bilateral    LEEP  08/2022   TYMPANOSTOMY TUBE PLACEMENT      OB/GYN HISTORY: OB History  Gravida Para Term Preterm AB Living  0 0 0 0 0 0  SAB IAB Ectopic Multiple Live Births  0 0 0 0 0      Age at menarche: 57 Age at menopause: n/a Hx of HRT: previously IUD, pills, planning to stop pills Hx of STI: no Last pap: see above History of abnormal pap smears: see above  SCREENING STUDIES:  Last mammogram: n/a Last colonoscopy: n/a  MEDICATIONS:  Current Outpatient Medications:    Albuterol  Sulfate (PROAIR  RESPICLICK) 108 (90 Base) MCG/ACT AEPB, Inhale 2 puffs into the lungs every 6 (six) hours as needed., Disp: 1 each, Rfl: 0   cyclobenzaprine  (FLEXERIL ) 5 MG tablet, Take 5-10 mg by mouth., Disp: , Rfl:    Erenumab -aooe (AIMOVIG ) 140 MG/ML SOAJ, Inject 140 mg into the skin every 28 (twenty-eight) days., Disp: 1.12 mL, Rfl: 5   ipratropium-albuterol  (DUONEB) 0.5-2.5 (3) MG/3ML SOLN, Inhale 3 mLs into the lungs every 2 (two) hours as needed (shortness of breath)., Disp: , Rfl:    metFORMIN (GLUCOPHAGE) 500 MG tablet, Take 500 mg by mouth daily., Disp: , Rfl:    ondansetron  (ZOFRAN ) 4 MG tablet, Take 1 tablet (4 mg total) by mouth 2 (two) times daily as needed., Disp: 20 tablet, Rfl: 5   pantoprazole (PROTONIX) 20 MG tablet, Take 20 mg by mouth daily as needed for heartburn., Disp: , Rfl:    ramelteon (ROZEREM) 8 MG tablet, Take 8 mg by mouth at bedtime., Disp: , Rfl:    tiZANidine  (ZANAFLEX) 4 MG tablet, , Disp: , Rfl:   ALLERGIES: Allergies  Allergen Reactions   Ciprofloxacin  Hives and Rash   Latex Rash   Ciprofloxacin  Hcl Hives   Codeine Other (See Comments)    "acts crazy"   Magnesium  Oxide -Mg Supplement     Hives and itching on magnesium  oxide   Clarithromycin Rash    FAMILY HISTORY: Family History  Problem Relation Age of Onset   Anemia Mother    Melanoma Mother    Breast cancer Maternal Grandmother    Colon cancer Paternal Grandfather    Prostate cancer Neg Hx    Pancreatic cancer Neg Hx    Ovarian cancer Neg Hx    Endometrial cancer Neg Hx     SOCIAL HISTORY: Social History   Socioeconomic History   Marital status: Divorced    Spouse name: Not on file   Number of children: 0   Years of education: 16   Highest education level: Not on file  Occupational History   Not on file  Tobacco Use   Smoking status: Never  Smokeless tobacco: Never  Vaping Use   Vaping status: Never Used  Substance and Sexual Activity   Alcohol use: Not Currently    Comment: occasional   Drug use: No   Sexual activity: Yes    Birth control/protection: None  Other Topics Concern   Not on file  Social History Narrative   Right handed   Lives with alone   Currently working   One floor home   Drinks caffeine prn   Social Drivers of Health   Financial Resource Strain: Low Risk  (02/07/2024)   Overall Financial Resource Strain (CARDIA)    Difficulty of Paying Living Expenses: Not hard at all  Food Insecurity: No Food Insecurity (02/07/2024)   Hunger Vital Sign    Worried About Running Out of Food in the Last Year: Never true    Ran Out of Food in the Last Year: Never true  Transportation Needs: No Transportation Needs (02/07/2024)   PRAPARE - Administrator, Civil Service (Medical): No    Lack of Transportation (Non-Medical): No  Physical Activity: Insufficiently Active (02/07/2024)   Exercise Vital Sign    Days of Exercise per Week: 3 days     Minutes of Exercise per Session: 40 min  Stress: Stress Concern Present (02/07/2024)   [employer] of Occupational Health - Occupational Stress Questionnaire    Feeling of Stress : Rather much  Social Connections: Socially Isolated (02/07/2024)   Social Connection and Isolation Panel [NHANES]    Frequency of Communication with Friends and Family: More than three times a week    Frequency of Social Gatherings with Friends and Family: Once a week    Attends Religious Services: Never    Database administrator or Organizations: No    Attends Banker Meetings: Never    Marital Status: Divorced  Catering manager Violence: Not At Risk (02/07/2024)   Humiliation, Afraid, Rape, and Kick questionnaire    Fear of Current or Ex-Partner: No    Emotionally Abused: No    Physically Abused: No    Sexually Abused: No    REVIEW OF SYSTEMS: New patient intake form was reviewed.  Complete 10-system review is negative except for the following: none  PHYSICAL EXAM: BP 124/85 (BP Location: Right Arm, Patient Position: Sitting)   Pulse 92   Temp 99 F (37.2 C) (Oral)   Resp 18   Ht 5\' 4"  (1.626 m)   Wt 207 lb (93.9 kg)   LMP 02/14/2024 (Exact Date)   SpO2 100%   BMI 35.53 kg/m  Constitutional: No acute distress. Neuro/Psych: Alert, oriented.  Head and Neck: Normocephalic, atraumatic. Neck symmetric without masses. Sclera anicteric.  Respiratory: Normal work of breathing. Clear to auscultation bilaterally. Cardiovascular: Regular rate and rhythm, no murmurs, rubs, or gallops. Abdomen: Normoactive bowel sounds. Soft, non-distended, non-tender to palpation.  Extremities: Grossly normal range of motion. Warm, well perfused. No edema bilaterally. Skin: No rashes or lesions. Lymphatic: No  inguinal adenopathy. Genitourinary: External genitalia without lesions. Urethral meatus without lesions or prolapse. On speculum exam, vagina and cervix without lesions. Bimanual exam reveals  normal cervix, mobile uterus. Exam chaperoned by Vira Grieves, NP  COLPOSCOPY PROCEDURE NOTE  Procedure Details: After appropriate verbal informed consent was obtained, a timeout was performed. A sterile speculum was placed in the vagina. Acetic acid was applied to the cervix, and the cervix was inspected with the colposcope with the findings as noted below. The speculum was removed from the vagina. The patient  tolerated the procedure well.   Adequate Exam: no transformation zone visualized  Biopsy Specimen: none  Condition: Stable. Patient tolerated procedure well.  Complications: None  Findings: small area of acetowhite changes at 12 o'clock and within the external cervical os   Colposcopic Impression: CIN2    LABORATORY AND RADIOLOGIC DATA: Outside medical records were reviewed to synthesize the above history, along with the history and physical obtained during the visit.  Outside laboratory, pathology reports were reviewed, with pertinent results below.    WBC  Date Value Ref Range Status  12/09/2019 8.4 4.0 - 10.5 K/uL Final   Hemoglobin  Date Value Ref Range Status  12/09/2019 13.3 12.0 - 15.0 g/dL Final   HCT  Date Value Ref Range Status  12/09/2019 41.4 36.0 - 46.0 % Final   Platelets  Date Value Ref Range Status  12/09/2019 300 150 - 400 K/uL Final   Creatinine, Ser  Date Value Ref Range Status  12/09/2019 0.75 0.44 - 1.00 mg/dL Final   AST  Date Value Ref Range Status  10/07/2014 18 0 - 37 U/L Final   ALT  Date Value Ref Range Status  10/07/2014 16 0 - 35 U/L Final   Diagnosis  Date Value Ref Range Status  02/07/2024 (A)  Final   - High grade squamous intraepithelial lesion (HSIL)   Surgical pathology (02/17/24): A. ENDOCERVIX, CURETTAGE:       High-grade squamous intraepithelial lesion (HSIL / CIN3).       Endocervical glands without significant diagnostic alteration.   B. CERVIX, 12 O'CLOCK, BIOPSY:       High-grade squamous intraepithelial  lesion (HSIL / CIN2).   C. CERVIX, 6 O'CLOCK, BIOPSY:       High-grade squamous intraepithelial lesion (HSIL / CIN2)

## 2024-03-23 ENCOUNTER — Telehealth: Payer: Self-pay | Admitting: Internal Medicine

## 2024-03-23 NOTE — Telephone Encounter (Signed)
 Patient c/o Palpitations:  STAT if patient reporting lightheadedness, shortness of breath, or chest pain  How long have you had palpitations/irregular HR/ Afib? Are you having the symptoms now? I am a pt of Dr. [name]' - I have been dealing with these symptoms for several years. No real issues since August 2024. Most recently the last 2 weeks have been bad for me.   Are you currently experiencing lightheadedness, SOB or CP? Yes, currently experiencing lightheadedness, SHOB, and chest pain intermittently.   Do you have a history of afib (atrial fibrillation) or irregular heart rhythm? No hx of AFIB, s/p heart cath to look at this 01/2023   Have you checked your BP or HR? (document readings if available): Yes, HR's variable - resting HR elevated to the 150's, with activity closer to 200's. BP's have been low to normal, but I have POTS so this is normal for me.   Are you experiencing any other symptoms? Palpitations and feeling like my heart is racing constantly. Having trouble doing normal daily activities without my HR increasing to a point when're I'm uncomfortable, followed by [facility].    Answered received via patient schedule. Please advise.

## 2024-03-23 NOTE — Telephone Encounter (Signed)
 Spoke with patient and she states her pots has been flaring up and its been over a year since she has been seen. No flare up today. First available was not until July. She would like to be seen sooner.

## 2024-03-24 ENCOUNTER — Other Ambulatory Visit: Payer: Self-pay

## 2024-03-24 ENCOUNTER — Ambulatory Visit (INDEPENDENT_AMBULATORY_CARE_PROVIDER_SITE_OTHER): Admitting: Radiology

## 2024-03-24 ENCOUNTER — Ambulatory Visit
Admission: RE | Admit: 2024-03-24 | Discharge: 2024-03-24 | Disposition: A | Discharge status: Home / Self Care | Source: Ambulatory Visit | Attending: Emergency Medicine | Admitting: Emergency Medicine

## 2024-03-24 ENCOUNTER — Encounter: Payer: Self-pay | Admitting: Internal Medicine

## 2024-03-24 ENCOUNTER — Encounter: Payer: Self-pay | Admitting: Women's Health

## 2024-03-24 VITALS — BP 127/83 | HR 109 | Temp 98.2°F | Resp 17

## 2024-03-24 DIAGNOSIS — G90A Postural orthostatic tachycardia syndrome (POTS): Secondary | ICD-10-CM | POA: Diagnosis not present

## 2024-03-24 DIAGNOSIS — R000 Tachycardia, unspecified: Secondary | ICD-10-CM

## 2024-03-24 DIAGNOSIS — R0602 Shortness of breath: Secondary | ICD-10-CM

## 2024-03-24 NOTE — Discharge Instructions (Signed)
 Your EKG showed sinus tachycardia.  Your chest x-ray did not show any pneumonia or obvious abnormalities.  We have checked some basic labs and we will contact you if anything requires urgent follow-up.  Ensure you are staying well-hydrated with at least 64 ounces of water daily and getting plenty of rest.  Change positions slowly.  Follow-up with your POTS specialist.  Seek immediate care at the nearest emergency department if you develop sustained heart rate over 200, continued chest pain, shortness of breath, syncope, loss of consciousness, or any new concerning symptoms.

## 2024-03-24 NOTE — Progress Notes (Signed)
 Patient here for new patient consultation with Dr. Daisey Dryer and for a pre-operative appointment prior to her scheduled surgery on 04/18/2024. She is scheduled for pelvic examination under anesthesia, cold knife conization of the cervix (cone shaped biopsy of the cervix), endocervical curettage. The surgery was discussed in detail.  See after visit summary for additional details.    Discussed post-op pain management in detail including the aspects of the enhanced recovery pathway.  Advised her that a new prescription would be sent in for tramadol  and it is only to be used for after her upcoming surgery.  We discussed the use of tylenol  post-op and to monitor for a maximum of 4,000 mg in a 24 hour period.  Also prescribed sennakot to be used after surgery and to hold if having loose stools.  Discussed bowel regimen in detail.     Discussed measures to take at home to prevent DVT including frequent mobility.  Reportable signs and symptoms of DVT discussed. Post-operative instructions discussed and expectations for after surgery.     5 minutes spent with the patient.  Verbalizing understanding of material discussed. No needs or concerns voiced at the end of the visit.   Advised patient to call for any needs.  Advised that her post-operative medications had been prescribed and could be picked up at any time.    This appointment is included in the global surgical bundle as pre-operative teaching and has no charge.

## 2024-03-24 NOTE — Patient Instructions (Signed)
 Preparing for your Surgery  Plan for surgery on 04/18/2024 with Dr. Derrel Flies at [facility]. You will be scheduled for pelvic examination under anesthesia, cold knife conization of the cervix (cone shaped biopsy of the cervix), endocervical curettage (sampling from the inner canal of the cervix).   Pre-operative Testing -You will receive a phone call from presurgical testing at [facility] to discuss surgery instructions and arrange for lab work if needed.  -Bring your insurance card, copy of an advanced directive if applicable, medication list.  -You should not be taking blood thinners or aspirin at least ten days prior to surgery unless instructed by your surgeon.  -Do not take supplements such as fish oil (omega 3), red yeast rice, turmeric before your surgery. You want to avoid medications with aspirin in them including headache powders such as BC or Goody's), Excedrin migraine.  Day Before Surgery at Home -You will be advised you can have clear liquids up until 3 hours before your surgery.    Your role in recovery Your role is to become active as soon as directed by your doctor, while still giving yourself time to heal.  Rest when you feel tired. You will be asked to do the following in order to speed your recovery:  - Cough and breathe deeply. This helps to clear and expand your lungs and can prevent pneumonia after surgery.  - STAY ACTIVE WHEN YOU GET HOME. Do mild physical activity. Walking or moving your legs help your circulation and body functions return to normal. Do not try to get up or walk alone the first time after surgery.   -If you develop swelling on one leg or the other, pain in the back of your leg, redness/warmth in one of your legs, please call the office or go to the Emergency Room to have a doppler to rule out a blood clot. For shortness of breath, chest pain-seek care in the Emergency Room as soon as possible. - Actively manage your pain.  Managing your pain lets you move in comfort. We will ask you to rate your pain on a scale of zero to 10. It is your responsibility to tell your doctor or nurse where and how much you hurt so your pain can be treated.  Special Considerations -Your final pathology results from surgery should be available around one week after surgery and the results will be relayed to you when available.  -FMLA forms can be faxed to [phone] and please allow 5-7 business days for completion.  Pain Management After Surgery -You will be prescribed your pain medication and bowel regimen medications before surgery so that you can have these available when you are discharged from the hospital. The pain medication is for use ONLY AFTER surgery and a new prescription will not be given.   -Make sure that you have Tylenol  and Ibuprofen  at home IF YOU ARE ABLE TO TAKE THESE MEDICATIONS to use on a regular basis after surgery for pain control. We recommend alternating the medications every hour to six hours since they work differently and are processed in the body differently for pain relief.  -Review the attached handout on narcotic use and their risks and side effects.   Bowel Regimen -You will be prescribed Sennakot-S to take nightly to prevent constipation especially if you are taking the narcotic pain medication intermittently.  It is important to prevent constipation and drink adequate amounts of liquids. You can stop taking this medication when you are not taking  pain medication and you are back on your normal bowel routine.  Risks of Surgery Risks of surgery are low but include bleeding, infection, damage to surrounding structures, re-operation, blood clots, and very rarely death.  AFTER SURGERY INSTRUCTIONS   Return to work: 1-2 days after surgery (Thursday return after Tuesday procedure)   You may notice a piece of gauze like-material come out of the vagina over the next several days. This is NORMAL and is  used during surgery to help stop bleeding at the cervix.    Activity: 1. Be up and out of the bed during the day.  Take a nap if needed.  You may walk up steps but be careful and use the hand rail.  Stair climbing will tire you more than you think, you may need to stop part way and rest.    2. No lifting or straining for 4 weeks over 10 pounds. No pushing, pulling, straining for 4 weeks.   3. No driving for minimum 24 hours after surgery but usually it is longer when the following criteria have been met: Do not drive if you are taking narcotic pain medicine and make sure that your reaction time has returned.    4. You can shower as soon as the next day after surgery. Shower daily. No tub baths or submerging your body in water until cleared by your surgeon. If you have the soap that was given to you by pre-surgical testing that was used before surgery, you do not need to use it afterwards because this can irritate your incisions.    5. No sexual activity and nothing in the vagina for 4-6 weeks, until seen in the office.   6. You may experience vaginal spotting and discharge after surgery.  The spotting is normal but if you experience heavy bleeding, call our office.   7. Take Tylenol  or ibuprofen  first for pain if you are able to take these medications and only use narcotic pain medication for severe pain not relieved by the Tylenol  or Ibuprofen .  Monitor your Tylenol  intake to a max of 4,000 mg in a 24 hour period. You can alternate these medications after surgery.   Diet: 1. Low sodium Heart Healthy Diet is recommended but you are cleared to resume your normal (before surgery) diet after your procedure.   2. It is safe to use a laxative, such as Miralax or Colace, if you have difficulty moving your bowels. You will be prescribed Sennakot at bedtime every evening to keep bowel movements regular and to prevent constipation.     Wound Care: 1. Keep clean and dry.  Shower daily.   Reasons to  call the Doctor: Fever - Oral temperature greater than 100.4 degrees Fahrenheit Foul-smelling vaginal discharge Difficulty urinating Nausea and vomiting Increased pain at the site of the incision that is unrelieved with pain medicine. Difficulty breathing with or without chest pain New calf pain especially if only on one side Sudden, continuing increased vaginal bleeding with or without clots.   Contacts: For questions or concerns you should contact:   Dr. Derrel Flies at [phone]   Vira Grieves, NP at [phone]   After Hours: call [phone] and have the GYN Oncologist paged/contacted (after 5 pm or on the weekends).   Messages sent via mychart are for non-urgent matters and are not responded to after hours so for urgent needs, please call the after hours number.

## 2024-03-24 NOTE — ED Provider Notes (Addendum)
 [name]    CSN: [id number] Arrival date & time: 03/24/24  1244      History   Chief Complaint Chief Complaint  Patient presents with   Fatigue    Entered by patient    HPI Kari Reilly is a 28 y.o. female.   Patient presents to clinic over concerns of palpitations, elevated heart rate and shortness of breath that has been ongoing but worsening over the past 2 weeks.  Yesterday she felt faint while doing some household chores and had to stop.  While at work and doing any kind of physical activity her heart rate will get up in the 180s to the 200s.  At rest she noticed her heart rate is 110s to 170s.  Hx of obesity, depression, bipolar, PCOS, asthma, POTS.   Was seen most recent by cardiology in 05/2023 and had exam consistent with POTS. First cardiology visit in March of 2023. Tried metoprolol, did not feel well with this. Normal echo in March 2023. Zio patch in Feb 2023 showed SR/ST, repeat Zio in 2024 showed SVT. EP study ion March did not induce any arrhythmias. With tilt table she had tachycardia. Never had syncope.   Cardiology recommended ivabradine  2.5 mg BID for symptoms and follow-up for response, at the time patient and her husband were trying to conceive so she did not start this medication.  She did recently start progesterone only birth control Drospirenone  2 weeks ago.  Has also felt sick for the past 2 weeks with fatigue, reports sleeping 16 or 17 hours yesterday.  She has had a sore throat for the past month.  Has not had any congestion or rhinorrhea.  Denies cough.  Did check for COVID and flu at home, this was negative.  The history is provided by the patient and medical records.    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 Authorizing Provider  Albuterol  Sulfate (PROAIR  RESPICLICK) 108 (90 Base) MCG/ACT AEPB Inhale 2 puffs into the lungs every 6 (six) hours as needed. 04/17/18   Buena Carmine, NP  cyclobenzaprine  (FLEXERIL ) 5 MG tablet Take 5-10 mg by mouth. 11/01/23  [provider]  Erenumab -aooe (AIMOVIG ) 140 MG/ML SOAJ Inject 140 mg into the skin every 28 (twenty-eight) days. 02/09/24   Festus Hubert, Adam R, DO  ipratropium-albuterol  (DUONEB) 0.5-2.5 (3) MG/3ML SOLN Inhale 3 mLs into the lungs every 2 (two) hours as needed (shortness of breath). 08/15/20   [provider]  metFORMIN (GLUCOPHAGE) 500 MG tablet Take 500 mg by mouth daily.    [provider]  ondansetron  (ZOFRAN ) 4 MG tablet Take 1 tablet (4 mg total) by mouth 2 (two) times daily as needed. 02/09/24   Festus Hubert, Adam R, DO  pantoprazole (PROTONIX) 20 MG tablet Take 20 mg by mouth daily as needed for heartburn.    [provider]  ramelteon (ROZEREM) 8 MG tablet Take 8 mg by mouth at bedtime. 01/18/24   [provider]  senna-docusate (SENOKOT-S) 8.6-50 MG tablet Take 2 tablets by mouth at bedtime. For AFTER surgery, do not take if having diarrhea 03/20/24   Cross,  Melissa D, NP  tiZANidine (ZANAFLEX) 4 MG tablet     [provider]  traMADol  (ULTRAM ) 50 MG tablet Take 1 tablet (50 mg total) by mouth every 6 (six) hours as needed for severe pain (pain score 7-10). For AFTER surgery only, do not take and drive 6/0/45   Vira Grieves D, NP    Family History Family History  Problem Relation Age of Onset   Anemia Mother    Melanoma Mother    Breast cancer Maternal Grandmother    Colon cancer Paternal Grandfather    Prostate cancer Neg Hx    Pancreatic cancer Neg Hx    Ovarian cancer Neg Hx    Endometrial cancer Neg Hx     Social History Social History   Tobacco Use   Smoking status: Never   Smokeless tobacco: Never  Vaping Use   Vaping status: Never Used  Substance Use Topics   Alcohol use: Not Currently    Comment: occasional   Drug use: No     Allergies   Ciprofloxacin , Latex, Ciprofloxacin  hcl, Codeine, Magnesium  oxide -mg supplement, and Clarithromycin   Review of Systems Review of Systems  Per HPI  Physical Exam Triage Vital Signs ED Triage Vitals  Encounter Vitals Group     BP      Systolic BP Percentile      Diastolic BP Percentile      Pulse      Resp      Temp      Temp src      SpO2      Weight      Height      Head Circumference      Peak Flow      Pain Score      Pain Loc      Pain Education      Exclude from Growth Chart    No data found.  Updated Vital Signs BP 127/83 (BP Location: Right Arm)   Pulse (!) 109   Temp 98.2 F (36.8 C) (Oral)   Resp 17   LMP 03/13/2024 (Approximate)   SpO2 98%   Visual Acuity Right Eye Distance:   Left Eye Distance:   Bilateral Distance:    Right Eye Near:   Left Eye Near:    Bilateral Near:     Physical Exam Vitals and nursing note reviewed.  Constitutional:      Appearance: Normal appearance.  HENT:     Head: Normocephalic and atraumatic.     Right  Ear: External ear normal.     Left Ear: External ear normal.     Nose: Nose normal.      Mouth/Throat:     Mouth: Mucous membranes are moist.     Pharynx: Posterior oropharyngeal erythema present.  Eyes:     Conjunctiva/sclera: Conjunctivae normal.  Cardiovascular:     Rate and Rhythm: Regular rhythm. Tachycardia present.     Heart sounds: Normal heart sounds. No murmur heard. Pulmonary:     Effort: Pulmonary effort is normal. No respiratory distress.     Breath sounds: No wheezing.  Skin:    General: Skin is warm and dry.  Neurological:     General: No focal deficit present.     Mental Status: She is alert.  Psychiatric:        Mood and Affect: Mood normal.      UC Treatments / Results  Labs (all labs ordered are listed, but only abnormal results are displayed) Labs Reviewed  CBC  COMPREHENSIVE METABOLIC PANEL WITH GFR  MAGNESIUM     EKG   Radiology No results found.  Procedures Procedures (including critical care time)  Medications Ordered in UC Medications - No data to display  Initial Impression / Assessment and Plan / UC Course  I have reviewed the triage vital signs and the nursing notes.  Pertinent labs & imaging results that were available during my care of the patient were reviewed by me and considered in my medical decision making (see chart for details).  Vitals in triage reviewed, patient is hemodynamically stable.  Lungs are vesicular, heart with regular rate and rhythm, mild tachycardia.  History of POTS and tachycardia.  Chest x-ray by my interpretation does not show obvious abnormalities.  Will check basic labs and magnesium  to rule out electrolyte abnormalities.  EKG shows ventricular rate of 102 bpm, without ST elevation or ST depression.  Does have sore throat, suspect potential for viral illness that may be triggering POTS symptoms.  Did recently start progesterone only contraceptive, palpitations and tachycardia are not listed as side effects.  Suspect exacerbation of POTS.  Symptomatic management encouraged.  Cardiology  follow-up advised.  Strict emergency precautions given if symptoms evolve.  Plan of care, follow-up care and return precautions given, no questions at this time.  Radiology overread shows no acute cardiopulmonary disease process, no updates to treatment plan at this time.     Final Clinical Impressions(s) / UC Diagnoses   Final diagnoses:  Shortness of breath  Tachycardia  POTS (postural orthostatic tachycardia syndrome)     Discharge Instructions      Your EKG showed sinus tachycardia.  Your chest x-ray did not show any pneumonia or obvious abnormalities.  We have checked some basic labs and we will contact you if anything requires urgent follow-up.  Ensure you are staying well-hydrated with at least 64 ounces of water daily and getting plenty of rest.  Change positions slowly.  Follow-up with your POTS specialist.  Seek immediate care at the nearest emergency department if you develop sustained heart rate over 200, continued chest pain, shortness of breath, syncope, loss of consciousness, or any new concerning symptoms.    ED Prescriptions   None    PDMP not reviewed this encounter.   Ayesha Lente, FNP 03/24/24 1400    Harlow Lighter, Murrel Freet  N, FNP 03/24/24 1517

## 2024-03-24 NOTE — ED Triage Notes (Addendum)
 Pt has hx of potts. States over the last two weeks she has been feeling fatigue, sob and heart rate has been constantly up to 100-115 resting and active 120s. Today HR up to 180 Her specialist cannot see here till July and wanted her to come to urgent care. States only change she has made recently was starting new birthcontrol about 2 weeks ago  Took Covid/flu test at home were negative

## 2024-03-25 LAB — CBC
Hematocrit: 45.1 % (ref 34.0–46.6)
Hemoglobin: 15.1 g/dL (ref 11.1–15.9)
MCH: 30.9 pg (ref 26.6–33.0)
MCHC: 33.5 g/dL (ref 31.5–35.7)
MCV: 92 fL (ref 79–97)
Platelets: 302 10*3/uL (ref 150–450)
RBC: 4.88 x10E6/uL (ref 3.77–5.28)
RDW: 11.7 % (ref 11.7–15.4)
WBC: 7.3 10*3/uL (ref 3.4–10.8)

## 2024-03-25 LAB — COMPREHENSIVE METABOLIC PANEL WITH GFR
ALT: 10 IU/L (ref 0–32)
AST: 15 IU/L (ref 0–40)
Albumin: 4.8 g/dL (ref 4.0–5.0)
Alkaline Phosphatase: 91 IU/L (ref 44–121)
BUN/Creatinine Ratio: 20 (ref 9–23)
BUN: 19 mg/dL (ref 6–20)
Bilirubin Total: 0.5 mg/dL (ref 0.0–1.2)
CO2: 18 mmol/L — ABNORMAL LOW (ref 20–29)
Calcium: 9.9 mg/dL (ref 8.7–10.2)
Chloride: 101 mmol/L (ref 96–106)
Creatinine, Ser: 0.96 mg/dL (ref 0.57–1.00)
Globulin, Total: 2.4 g/dL (ref 1.5–4.5)
Glucose: 79 mg/dL (ref 70–99)
Potassium: 4.7 mmol/L (ref 3.5–5.2)
Sodium: 140 mmol/L (ref 134–144)
Total Protein: 7.2 g/dL (ref 6.0–8.5)
eGFR: 83 mL/min/{1.73_m2} (ref >59)

## 2024-03-25 LAB — MAGNESIUM: Magnesium: 2.2 mg/dL (ref 1.6–2.3)

## 2024-03-28 NOTE — Patient Instructions (Signed)
 SURGICAL WAITING ROOM VISITATION  Patients having surgery or a procedure may have no more than 2 support people in the waiting area - these visitors may rotate.    Children under the age of 7 must have an adult with them who is not the patient.  Due to an increase in RSV and influenza rates and associated hospitalizations, children ages 69 and under may not visit patients in [facility] hospitals.  Visitors with respiratory illnesses are discouraged from visiting and should remain at home.  If the patient needs to stay at the hospital during part of their recovery, the visitor guidelines for inpatient rooms apply. Pre-op nurse will coordinate an appropriate time for 1 support person to accompany patient in pre-op.  This support person may not rotate.    Please refer to the [facility] website for the visitor guidelines for Inpatients (after your surgery is over and you are in a regular room).       Your procedure is scheduled on:  04/18/2024    Report to [facility] Main Entrance    Report to admitting at  0515 AM   Call this number if you have problems the morning of surgery [phone]   Do not eat food :After Midnight.   After Midnight you may have the following liquids until ______ AM/ PM DAY OF SURGERY  Water Non-Citrus Juices (without pulp, NO RED-Apple, White grape, White cranberry) Black Coffee (NO MILK/CREAM OR CREAMERS, sugar ok)  Clear Tea (NO MILK/CREAM OR CREAMERS, sugar ok) regular and decaf                             Plain Jell-O (NO RED)                                           Fruit ices (not with fruit pulp, NO RED)                                     Popsicles (NO RED)                                                               Sports drinks like G                If you have questions, please contact your surgeon's office.      Oral Hygiene is also important to reduce your risk of infection.                                    Remember -  BRUSH YOUR TEETH THE MORNING OF SURGERY WITH YOUR REGULAR TOOTHPASTE  DENTURES WILL BE REMOVED PRIOR TO SURGERY PLEASE DO NOT APPLY "Poly grip" OR ADHESIVES!!!   Do NOT smoke after Midnight   Stop all vitamins and herbal supplements 7 days before surgery.   Take these medicines the morning of surgery with A SIP OF WATER:  inhalers as usual and bring, zyrtec, nebulizer if needed, protonix  if needed              Metformin- none am of surgery             Zepbound- last dose on   DO NOT TAKE ANY ORAL DIABETIC MEDICATIONS DAY OF YOUR SURGERY  Bring CPAP mask and tubing day of surgery.                              You may not have any metal on your body including hair pins, jewelry, and body piercing             Do not wear make-up, lotions, powders, perfumes/cologne, or deodorant  Do not wear nail polish including gel and S&S, artificial/acrylic nails, or any other type of covering on natural nails including finger and toenails. If you have artificial nails, gel coating, etc. that needs to be removed by a nail salon please have this removed prior to surgery or surgery may need to be canceled/ delayed if the surgeon/ anesthesia feels like they are unable to be safely monitored.   Do not shave  48 hours prior to surgery.               Men may shave face and neck.   Do not bring valuables to the hospital. [facility] IS NOT             RESPONSIBLE   FOR VALUABLES.   Contacts, glasses, dentures or bridgework may not be worn into surgery.   Bring small overnight bag day of surgery.   DO NOT BRING YOUR HOME MEDICATIONS TO THE HOSPITAL. PHARMACY WILL DISPENSE MEDICATIONS LISTED ON YOUR MEDICATION LIST TO YOU DURING YOUR ADMISSION IN THE HOSPITAL!    Patients discharged on the day of surgery will not be allowed to drive home.  Someone NEEDS to stay with you for the first 24 hours after anesthesia.   Special Instructions: Bring a copy of your healthcare power of attorney and living will  documents the day of surgery if you haven't scanned them before.              Please read over the following fact sheets you were given: IF YOU HAVE QUESTIONS ABOUT YOUR PRE-OP INSTRUCTIONS PLEASE CALL [phone]   If you received a COVID test during your pre-op visit  it is requested that you wear a mask when out in public, stay away from anyone that may not be feeling well and notify your surgeon if you develop symptoms. If you test positive for Covid or have been in contact with anyone that has tested positive in the last 10 days please notify you surgeon.    [place] - Preparing for Surgery Before surgery, you can play an important role.  Because skin is not sterile, your skin needs to be as free of germs as possible.  You can reduce the number of germs on your skin by washing with CHG (chlorahexidine gluconate) soap before surgery.  CHG is an antiseptic cleaner which kills germs and bonds with the skin to continue killing germs even after washing. Please DO NOT use if you have an allergy to CHG or antibacterial soaps.  If your skin becomes reddened/irritated stop using the CHG and inform your nurse when you arrive at Short Stay. Do not shave (including legs and underarms) for at least 48 hours prior to the first CHG shower.  You may shave your face/neck.  Please follow these instructions carefully:  1.  Shower with CHG Soap the night before surgery and the  morning of Surgery.  2.  If you choose to wash your hair, wash your hair first as usual with your  normal  shampoo.  3.  After you shampoo, rinse your hair and body thoroughly to remove the  shampoo.                           4.  Use CHG as you would any other liquid soap.  You can apply chg directly  to the skin and wash                       Gently with a scrungie or clean washcloth.  5.  Apply the CHG Soap to your body ONLY FROM THE NECK DOWN.   Do not use on face/ open                           Wound or open sores. Avoid contact with  eyes, ears mouth and genitals (private parts).                       Wash face,  Genitals (private parts) with your normal soap.             6.  Wash thoroughly, paying special attention to the area where your surgery  will be performed.  7.  Thoroughly rinse your body with warm water from the neck down.  8.  DO NOT shower/wash with your normal soap after using and rinsing off  the CHG Soap.                9.  Pat yourself dry with a clean towel.            10.  Wear clean pajamas.            11.  Place clean sheets on your bed the night of your first shower and do not  sleep with pets. Day of Surgery : Do not apply any lotions/deodorants the morning of surgery.  Please wear clean clothes to the hospital/surgery center.  FAILURE TO FOLLOW THESE INSTRUCTIONS MAY RESULT IN THE CANCELLATION OF YOUR SURGERY PATIENT SIGNATURE_________________________________  NURSE SIGNATURE__________________________________  ________________________________________________________________________

## 2024-03-28 NOTE — Progress Notes (Addendum)
 Anesthesia Review:  PCP: Tonia Frankel- LOV 03/16/24  [facility] is her PCP.  PT reorts at phone call  her PCP left the practice.   Cardiologist : Ola Berger- LOV 05/19/23  Pulm- Myah Hicks-Wiggins LVO 09/15/23   PPM/ ICD: Device Orders: Rep Notified:  Chest x-ray : 2v-03/24/24  EKG : 03/24/24  Echo : Stress test: Cardiac Cath :   Activity level: can do a flight of stairs without difficutly  Sleep Study/ CPAP : none  Fasting Blood Sugar :      / Checks Blood Sugar -- times a day:     Metformin- none am of surgery -taakes for PCOS Zepbound- Last dose on  04/10/24 - takes for weight loss   Blood Thinner/ Instructions Audry Blinks Dose: ASA / Instructions/ Last Dose :    03/24/24- cbc and CMP  03/24/24- Urgent Care Visit- shrotness of breath, tachycardia and POTS    Phone call appt completed on 04/03/2024.  Med hx and preop instructions completed.  PT is a Theatre manager at [employer].     PT had period on 03/15/2024  approximately .   PT  reports at phone appt that she has had bleeding  for the alst week or so.  PT became a phone appt on 04/03/2024 due to pt reports having a stomach bug that started on 03/31/2024.  " I feel better but did not want to take any chances".  AT time of phone appt pt was asked to call Admitting at [phone] at end of phone appt.  PT voiced understanding. DR Daisey Dryer office was made aware of the " stomach bug".  Spoke with April.

## 2024-04-03 ENCOUNTER — Other Ambulatory Visit: Payer: Self-pay

## 2024-04-03 ENCOUNTER — Encounter (HOSPITAL_COMMUNITY)
Admission: RE | Admit: 2024-04-03 | Discharge: 2024-04-03 | Disposition: A | Discharge status: Home / Self Care | Source: Ambulatory Visit | Attending: Psychiatry | Admitting: Psychiatry

## 2024-04-03 ENCOUNTER — Encounter (HOSPITAL_COMMUNITY): Payer: Self-pay

## 2024-04-03 DIAGNOSIS — Z01818 Encounter for other preprocedural examination: Secondary | ICD-10-CM

## 2024-04-03 HISTORY — DX: Anxiety disorder, unspecified: F41.9

## 2024-04-03 HISTORY — DX: Gastro-esophageal reflux disease without esophagitis: K21.9

## 2024-04-03 HISTORY — DX: Pneumonia, unspecified organism: J18.9

## 2024-04-05 ENCOUNTER — Encounter: Payer: Self-pay | Admitting: Psychiatry

## 2024-04-05 NOTE — Telephone Encounter (Signed)
 Follow Up:      Patient said she talked to a nurse on 03-23-24 and sent a  My Chart message: She says she is still waiting to hear something please

## 2024-04-05 NOTE — Telephone Encounter (Signed)
 Returned call to patient to inform her that [name] is out of office until May 25, 2024 and appt is currently scheduled for one week later. She states that she's feeling much better this week than last and was only calling because she's scheduled for procedure on 04/18/24 to remove cancer cells and was worried that they wouldn't clear her for this surgery. She is feeling fine and states she will keep current appt with [name], the surgery was her main concern. Advised pt that if she arrived that morning for her procedure and HR was very high or BP very low, they still could call it off and we wouldn't have much control over that. She understands and has reached out to her OBGYN who's doing the surgery to make sure she's fine.

## 2024-04-06 ENCOUNTER — Telehealth: Payer: Self-pay | Admitting: Neurology

## 2024-04-06 ENCOUNTER — Encounter: Payer: Self-pay | Admitting: Neurology

## 2024-04-06 ENCOUNTER — Telehealth: Payer: Self-pay

## 2024-04-06 NOTE — Telephone Encounter (Signed)
 Pt. Came in for headache cocktail w/ no driver, xplnd driver must be present by phone and in person. He could not make it from [place] and she left asking for Sheena of Dr. Festus Hubert to call her back

## 2024-04-06 NOTE — Telephone Encounter (Signed)
 Pt called informed that she needs a driver for the headache cocktail she needs to be here by 4pm if she wants it, she stated that she is going to see if her boyfriend can get back in town form working in [place] if he can she will come an get it, she stated that she has prednisone  at home because she is on it a lot because of her asthma ,

## 2024-04-06 NOTE — Telephone Encounter (Signed)
Pt called see phone note °

## 2024-04-07 ENCOUNTER — Ambulatory Visit (INDEPENDENT_AMBULATORY_CARE_PROVIDER_SITE_OTHER)

## 2024-04-07 DIAGNOSIS — G44229 Chronic tension-type headache, not intractable: Secondary | ICD-10-CM | POA: Diagnosis not present

## 2024-04-07 MED ORDER — KETOROLAC TROMETHAMINE 60 MG/2ML IM SOLN
60.0000 mg | Freq: Once | INTRAMUSCULAR | Status: AC
  Administered 2024-04-07: 60 mg via INTRAMUSCULAR

## 2024-04-07 MED ORDER — DIPHENHYDRAMINE HCL 50 MG/ML IJ SOLN
50.0000 mg | Freq: Once | INTRAMUSCULAR | Status: AC
Start: 2024-04-07 — End: 2024-04-07
  Administered 2024-04-07: 25 mg via INTRAMUSCULAR

## 2024-04-07 MED ORDER — METOCLOPRAMIDE HCL 5 MG/ML IJ SOLN
10.0000 mg | Freq: Once | INTRAMUSCULAR | Status: AC
  Administered 2024-04-07: 10 mg via INTRAMUSCULAR

## 2024-04-07 NOTE — Telephone Encounter (Signed)
 Patient will arrive at 10:45 or 50 to get headache cocktail with her driver.   Advised patient she has to have the driver in the building with her.

## 2024-04-11 ENCOUNTER — Other Ambulatory Visit: Payer: Self-pay | Admitting: Neurology

## 2024-04-11 MED ORDER — PREDNISONE 10 MG PO TABS: ORAL_TABLET | ORAL | 0 refills | Status: DC

## 2024-04-14 MED ORDER — TOPIRAMATE 25 MG PO TABS: ORAL_TABLET | ORAL | 0 refills | Status: DC

## 2024-04-17 ENCOUNTER — Telehealth: Payer: Self-pay | Admitting: *Deleted

## 2024-04-17 NOTE — Discharge Instructions (Addendum)
 AFTER SURGERY INSTRUCTIONS   Return to work: 1-2 days after surgery (Thursday return after Tuesday procedure)   You may notice a piece of gauze like-material come out of the vagina over the next several days. This is NORMAL and is used during surgery to help stop bleeding at the cervix.    Activity: 1. Be up and out of the bed during the day.  Take a nap if needed.  You may walk up steps but be careful and use the hand rail.  Stair climbing will tire you more than you think, you may need to stop part way and rest.    2. No lifting or straining for 4 weeks over 10 pounds. No pushing, pulling, straining for 4 weeks.   3. No driving for minimum 24 hours after surgery but usually it is longer when the following criteria have been met: Do not drive if you are taking narcotic pain medicine and make sure that your reaction time has returned.    4. You can shower as soon as the next day after surgery. Shower daily. No tub baths or submerging your body in water until cleared by your surgeon. If you have the soap that was given to you by pre-surgical testing that was used before surgery, you do not need to use it afterwards because this can irritate your incisions.    5. No sexual activity and nothing in the vagina for 4-6 weeks, until seen in the office.   6. You may experience vaginal spotting and discharge after surgery.  The spotting is normal but if you experience heavy bleeding, call our office.   7. Take Tylenol  or ibuprofen  first for pain if you are able to take these medications and only use narcotic pain medication for severe pain not relieved by the Tylenol  or Ibuprofen .  Monitor your Tylenol  intake to a max of 4,000 mg in a 24 hour period. You can alternate these medications after surgery.   Diet: 1. Low sodium Heart Healthy Diet is recommended but you are cleared to resume your normal (before surgery) diet after your procedure.   2. It is safe to use a laxative, such as Miralax or Colace,  if you have difficulty moving your bowels. You will be prescribed Sennakot at bedtime every evening to keep bowel movements regular and to prevent constipation.     Wound Care: 1. Keep clean and dry.  Shower daily.   Reasons to call the Doctor: Fever - Oral temperature greater than 100.4 degrees Fahrenheit Foul-smelling vaginal discharge Difficulty urinating Nausea and vomiting Increased pain at the site of the incision that is unrelieved with pain medicine. Difficulty breathing with or without chest pain New calf pain especially if only on one side Sudden, continuing increased vaginal bleeding with or without clots.   Contacts: For questions or concerns you should contact:   Dr. Derrel Flies at [phone]   Vira Grieves, NP at [phone]   After Hours: call [phone] and have the GYN Oncologist paged/contacted (after 5 pm or on the weekends).   Messages sent via mychart are for non-urgent matters and are not responded to after hours so for urgent needs, please call the after hours number.

## 2024-04-17 NOTE — Anesthesia Preprocedure Evaluation (Addendum)
 Anesthesia Evaluation  Patient identified by MRN, date of birth, ID band Patient awake    Reviewed: Allergy & Precautions, NPO status , Patient's Chart, lab work & pertinent test results  History of Anesthesia Complications Negative for: history of anesthetic complications  Airway Mallampati: II  TM Distance: >3 FB Neck ROM: Full    Dental no notable dental hx.    Pulmonary asthma , Patient abstained from smoking.   Pulmonary exam normal        Cardiovascular negative cardio ROS Normal cardiovascular exam     Neuro/Psych  Headaches  Anxiety        GI/Hepatic Neg liver ROS,GERD  Medicated,,  Endo/Other  Oral Hypoglycemic Agents  On Zepbound  Renal/GU negative Renal ROS     Musculoskeletal negative musculoskeletal ROS (+)    Abdominal   Peds  Hematology negative hematology ROS (+)   Anesthesia Other Findings CERVICAL DYSPLASIA  Reproductive/Obstetrics                              Anesthesia Physical Anesthesia Plan  ASA: 2  Anesthesia Plan: General   Post-op Pain Management: Tylenol  PO (pre-op)* and Toradol  IV (intra-op)*   Induction: Intravenous  PONV Risk Score and Plan: 4 or greater and Treatment may vary due to age or medical condition, Ondansetron , Dexamethasone , Midazolam, Scopolamine patch - Pre-op, Propofol infusion and TIVA  Airway Management Planned: LMA  Additional Equipment: None  Intra-op Plan:   Post-operative Plan: Extubation in OR  Informed Consent: I have reviewed the patients History and Physical, chart, labs and discussed the procedure including the risks, benefits and alternatives for the proposed anesthesia with the patient or authorized representative who has indicated his/her understanding and acceptance.     Dental advisory given  Plan Discussed with: CRNA  Anesthesia Plan Comments:         Anesthesia Quick Evaluation

## 2024-04-17 NOTE — Telephone Encounter (Signed)
 Telephone call to check on pre-operative status.  Patient compliant with pre-operative instructions.  Reinforced nothing to eat after midnight. Clear liquids until 0415. Patient to arrive at 0515.  No questions or concerns voiced.  Instructed to call for any needs.

## 2024-04-18 ENCOUNTER — Other Ambulatory Visit: Payer: Self-pay

## 2024-04-18 ENCOUNTER — Encounter (HOSPITAL_COMMUNITY)
Admission: RE | Disposition: A | Discharge status: Home / Self Care | Payer: Self-pay | Source: Ambulatory Visit | Attending: Psychiatry

## 2024-04-18 ENCOUNTER — Telehealth: Payer: Self-pay

## 2024-04-18 ENCOUNTER — Encounter (HOSPITAL_COMMUNITY): Payer: Self-pay | Admitting: Psychiatry

## 2024-04-18 ENCOUNTER — Ambulatory Visit (HOSPITAL_COMMUNITY): Admitting: Anesthesiology

## 2024-04-18 ENCOUNTER — Ambulatory Visit (HOSPITAL_COMMUNITY)
Admission: RE | Admit: 2024-04-18 | Discharge: 2024-04-18 | Disposition: A | Discharge status: Home / Self Care | Source: Ambulatory Visit | Attending: Psychiatry | Admitting: Psychiatry

## 2024-04-18 DIAGNOSIS — D069 Carcinoma in situ of cervix, unspecified: Secondary | ICD-10-CM | POA: Diagnosis present

## 2024-04-18 DIAGNOSIS — N879 Dysplasia of cervix uteri, unspecified: Secondary | ICD-10-CM

## 2024-04-18 DIAGNOSIS — Z01818 Encounter for other preprocedural examination: Secondary | ICD-10-CM

## 2024-04-18 HISTORY — PX: CERVICAL CONIZATION W/BX: SHX1330

## 2024-04-18 LAB — POCT PREGNANCY, URINE: Preg Test, Ur: NEGATIVE

## 2024-04-18 SURGERY — CONE BIOPSY, CERVIX
Anesthesia: General

## 2024-04-18 MED ORDER — SCOPOLAMINE 1 MG/3DAYS TD PT72: 1.0000 | MEDICATED_PATCH | Freq: Once | TRANSDERMAL | Status: DC

## 2024-04-18 MED ORDER — FENTANYL CITRATE PF 50 MCG/ML IJ SOSY
PREFILLED_SYRINGE | INTRAMUSCULAR | Status: AC
  Filled 2024-04-18: qty 1

## 2024-04-18 MED ORDER — LACTATED RINGERS IV SOLN: INTRAVENOUS | Status: DC

## 2024-04-18 MED ORDER — FENTANYL CITRATE (PF) 100 MCG/2ML IJ SOLN
INTRAMUSCULAR | Status: DC | PRN
  Administered 2024-04-18: 50 ug via INTRAVENOUS

## 2024-04-18 MED ORDER — DEXMEDETOMIDINE HCL IN NACL 80 MCG/20ML IV SOLN
INTRAVENOUS | Status: DC | PRN
  Administered 2024-04-18: 8 ug via INTRAVENOUS

## 2024-04-18 MED ORDER — ACETIC ACID 5 % SOLN
Status: AC
  Filled 2024-04-18: qty 12

## 2024-04-18 MED ORDER — MONSELS FERRIC SUBSULFATE EX SOLN
CUTANEOUS | Status: DC | PRN
  Administered 2024-04-18: 1 via TOPICAL

## 2024-04-18 MED ORDER — DEXAMETHASONE SODIUM PHOSPHATE 10 MG/ML IJ SOLN
INTRAMUSCULAR | Status: AC
  Filled 2024-04-18: qty 1

## 2024-04-18 MED ORDER — ONDANSETRON HCL 4 MG/2ML IJ SOLN
INTRAMUSCULAR | Status: DC | PRN
  Administered 2024-04-18: 4 mg via INTRAVENOUS

## 2024-04-18 MED ORDER — DEXAMETHASONE SODIUM PHOSPHATE 10 MG/ML IJ SOLN
4.0000 mg | INTRAMUSCULAR | Status: AC
  Administered 2024-04-18: 4 mg via INTRAVENOUS

## 2024-04-18 MED ORDER — OXYCODONE HCL 5 MG PO TABS
ORAL_TABLET | ORAL | Status: AC
  Filled 2024-04-18: qty 1

## 2024-04-18 MED ORDER — SCOPOLAMINE 1 MG/3DAYS TD PT72
1.0000 | MEDICATED_PATCH | TRANSDERMAL | Status: DC
Start: 2024-04-18 — End: 2024-04-18
  Administered 2024-04-18: 1.5 mg via TRANSDERMAL
  Filled 2024-04-18: qty 1

## 2024-04-18 MED ORDER — PROPOFOL 10 MG/ML IV BOLUS
INTRAVENOUS | Status: AC
  Filled 2024-04-18: qty 20

## 2024-04-18 MED ORDER — IODINE STRONG (LUGOLS) 5 % PO SOLN
ORAL | Status: AC
  Filled 2024-04-18: qty 1

## 2024-04-18 MED ORDER — ONDANSETRON HCL 4 MG/2ML IJ SOLN
INTRAMUSCULAR | Status: AC
  Filled 2024-04-18: qty 2

## 2024-04-18 MED ORDER — LIDOCAINE HCL (PF) 2 % IJ SOLN
INTRAMUSCULAR | Status: AC
  Filled 2024-04-18: qty 10

## 2024-04-18 MED ORDER — LIDOCAINE HCL (CARDIAC) PF 100 MG/5ML IV SOSY
PREFILLED_SYRINGE | INTRAVENOUS | Status: DC | PRN
  Administered 2024-04-18: 100 mg via INTRAVENOUS

## 2024-04-18 MED ORDER — ACETAMINOPHEN 500 MG PO TABS
1000.0000 mg | ORAL_TABLET | ORAL | Status: AC
  Administered 2024-04-18: 1000 mg via ORAL
  Filled 2024-04-18: qty 2

## 2024-04-18 MED ORDER — DROPERIDOL 2.5 MG/ML IJ SOLN: 0.6250 mg | Freq: Once | INTRAMUSCULAR | Status: DC | PRN

## 2024-04-18 MED ORDER — SILVER NITRATE-POT NITRATE 75-25 % EX MISC
CUTANEOUS | Status: AC
  Filled 2024-04-18: qty 10

## 2024-04-18 MED ORDER — ORAL CARE MOUTH RINSE: 15.0000 mL | Freq: Once | OROMUCOSAL | Status: AC

## 2024-04-18 MED ORDER — ACETAMINOPHEN 500 MG PO TABS: 1000.0000 mg | ORAL_TABLET | Freq: Once | ORAL | Status: DC

## 2024-04-18 MED ORDER — MONSELS FERRIC SUBSULFATE EX SOLN
CUTANEOUS | Status: AC
  Filled 2024-04-18: qty 8

## 2024-04-18 MED ORDER — CHLORHEXIDINE GLUCONATE 0.12 % MT SOLN
15.0000 mL | Freq: Once | OROMUCOSAL | Status: AC
  Administered 2024-04-18: 15 mL via OROMUCOSAL

## 2024-04-18 MED ORDER — FENTANYL CITRATE PF 50 MCG/ML IJ SOSY
25.0000 ug | PREFILLED_SYRINGE | INTRAMUSCULAR | Status: DC | PRN
  Administered 2024-04-18: 50 ug via INTRAVENOUS

## 2024-04-18 MED ORDER — OXYCODONE HCL 5 MG PO TABS
5.0000 mg | ORAL_TABLET | Freq: Once | ORAL | Status: AC | PRN
  Administered 2024-04-18: 5 mg via ORAL

## 2024-04-18 MED ORDER — BUPIVACAINE HCL 0.25 % IJ SOLN
INTRAMUSCULAR | Status: DC | PRN
  Administered 2024-04-18: 10 mL

## 2024-04-18 MED ORDER — LACTATED RINGERS IV SOLN: INTRAVENOUS | Status: DC | PRN

## 2024-04-18 MED ORDER — FENTANYL CITRATE (PF) 100 MCG/2ML IJ SOLN
INTRAMUSCULAR | Status: AC
Start: 2024-04-18 — End: ?
  Filled 2024-04-18: qty 2

## 2024-04-18 MED ORDER — MIDAZOLAM HCL 2 MG/2ML IJ SOLN
INTRAMUSCULAR | Status: AC
  Filled 2024-04-18: qty 2

## 2024-04-18 MED ORDER — HEMOSTATIC AGENTS (NO CHARGE) OPTIME
TOPICAL | Status: DC | PRN
  Administered 2024-04-18: 1 via TOPICAL

## 2024-04-18 MED ORDER — MIDAZOLAM HCL 5 MG/5ML IJ SOLN
INTRAMUSCULAR | Status: DC | PRN
  Administered 2024-04-18: 2 mg via INTRAVENOUS

## 2024-04-18 MED ORDER — PROPOFOL 10 MG/ML IV BOLUS
INTRAVENOUS | Status: DC | PRN
  Administered 2024-04-18 (×2): 200 mg via INTRAVENOUS

## 2024-04-18 MED ORDER — BUPIVACAINE HCL (PF) 0.25 % IJ SOLN
INTRAMUSCULAR | Status: AC
  Filled 2024-04-18: qty 30

## 2024-04-18 MED ORDER — OXYCODONE HCL 5 MG/5ML PO SOLN: 5.0000 mg | Freq: Once | ORAL | Status: AC | PRN

## 2024-04-18 MED ORDER — IODINE STRONG (LUGOLS) 5 % PO SOLN
ORAL | Status: DC | PRN
  Administered 2024-04-18: 14 mL via ORAL

## 2024-04-18 SURGICAL SUPPLY — 25 items
BAG COUNTER SPONGE SURGICOUNT (BAG) IMPLANT
BLADE SURG SZ11 CARB STEEL (BLADE) ×1 IMPLANT
CATH ROBINSON RED A/P 16FR (CATHETERS) IMPLANT
CATH SILICONE 16FRX5CC (CATHETERS) IMPLANT
CURETTE PIPELLE ENDOMTRL SUCTN (MISCELLANEOUS) IMPLANT
DILATOR CANAL MILEX (MISCELLANEOUS) ×1 IMPLANT
ELECT COATED BLADE 2.86 ST (ELECTRODE) IMPLANT
ELECT LLETZ BALL 5MM DISP (ELECTRODE) IMPLANT
GLOVE BIOGEL PI MICRO STRL 6 (GLOVE) ×4 IMPLANT
GOWN STRL REUS W/ TWL LRG LVL3 (GOWN DISPOSABLE) ×1 IMPLANT
HEMOSTAT SURGICEL 2X4 FIBR (HEMOSTASIS) IMPLANT
KIT TURNOVER KIT A (KITS) IMPLANT
LEGGING LITHOTOMY PAIR STRL (DRAPES) IMPLANT
PACK VAGINAL WOMENS (CUSTOM PROCEDURE TRAY) ×1 IMPLANT
SCOPETTES 8 STERILE (MISCELLANEOUS) IMPLANT
SCRUB CHG 4% DYNA-HEX 4OZ (MISCELLANEOUS) IMPLANT
SOL PREP POV-IOD 4OZ 10% (MISCELLANEOUS) ×1 IMPLANT
SPONGE SURGIFOAM ABS GEL 12-7 (HEMOSTASIS) IMPLANT
SUT VIC AB 0 CT1 27XBRD ANTBC (SUTURE) ×2 IMPLANT
SUT VIC AB 2-0 SH 27X BRD (SUTURE) ×1 IMPLANT
SUT VIC AB 2-0 UR5 27 (SUTURE) IMPLANT
SUT VICRYL 0 UR6 27IN ABS (SUTURE) IMPLANT
SYR BULB IRRIG 60ML STRL (SYRINGE) ×1 IMPLANT
TOWEL OR 17X26 10 PK STRL BLUE (TOWEL DISPOSABLE) ×1 IMPLANT
YANKAUER SUCT BULB TIP NO VENT (SUCTIONS) IMPLANT

## 2024-04-18 NOTE — Op Note (Addendum)
 GYNECOLOGIC ONCOLOGY OPERATIVE NOTE  Date of Service: 04/18/2024  Preoperative Diagnosis: Cervical dysplasia  Postoperative Diagnosis: Same  Procedures: Cervical cold knife conization, endocervical curettage  Surgeon: Derrel Flies, MD  Assistants: None  Anesthesia: General  Estimated Blood Loss: 15 mL    Fluids: 600 ml, crystalloid  Urine Output: 30ml, clear yellow  Findings: Grossly normal appearing cervix. With Lugol's applied, slight decreased uptake at 12 o'clock. Hemostasis at conclusion of procedure.  Specimens:  ID Type Source Tests Collected by Time Destination  1 : Cone biopsy stitch at 12 o'clock Tissue Path Tissue SURGICAL PATHOLOGY Derrel Flies, MD 04/18/2024 [phone]   2 : Encocervical curettings GYN Endocervical Curettage SURGICAL PATHOLOGY Derrel Flies, MD 04/18/2024 0813     Complications:  None  Indications for Procedure: Kari Reilly is a 28 y.o. 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  Prior to the procedure, all risks, benefits, and alternatives were discussed and informed surgical consent was signed.  Procedure: The patient was taken to the operating room where she was prepped and draped in the normal sterile fashion in the dorsal lithotomy position.  Anesthesia was obtained without difficulty.  A weighted speculum was placed and a curved Deaver was placed into the anterior fornix. A cervical block was performed with injecting 5cc of 0.25% marcaine each at 3 and 9 o'clock. Two sutures of 0 Vicryl were used to ligate the cervical branches of the cervical artery at the 3 and 9 o'clock positions in a figure-of-eight suture and then tagged.   Lugol solution was applied to the cervix to visualize the transformation zone. Using an 11-blade scalpel, cold-knife cone specimen was then obtained in standard fashion.  A  stitch was placed at the 12 o'clock position. Next, endocervical curetting was performed of the canal. This was handed off also for pathologic review.  The base of the cervical cone specimen was then cauterized using Bovie cautery. Monsel's were then applied. The cone site was noted to be hemostatic. Surgicell was placed in the cone bed and the lateral sutures were gently tied over the cervix to secure this pack at the cervical bed.  An in and out catheterization was performed with clear yellow urine drained.  All the instruments were removed from the patient's vagina. Sponge, lap and needle counts were correct x2.   No antibiotics were indicated.  The patient tolerated the procedure well and was transferred to the recovery room in stable condition.   Derrel Flies, MD Gynecologic Oncology

## 2024-04-18 NOTE — Anesthesia Procedure Notes (Signed)
 Procedure Name: LMA Insertion Date/Time: 04/18/2024 8:44 AM  Performed by: Hunter Maha, CRNAPre-anesthesia Checklist: Patient identified, Emergency Drugs available, Suction available and Patient being monitored Patient Re-evaluated:Patient Re-evaluated prior to induction Oxygen Delivery Method: Circle system utilized Preoxygenation: Pre-oxygenation with 100% oxygen Induction Type: IV induction Ventilation: Mask ventilation without difficulty LMA: LMA inserted LMA Size: 4.0 Tube type: Oral Number of attempts: 1 Airway Equipment and Method: Stylet and Oral airway Placement Confirmation: positive ETCO2 and breath sounds checked- equal and bilateral Tube secured with: Tape Dental Injury: Teeth and Oropharynx as per pre-operative assessment

## 2024-04-18 NOTE — Telephone Encounter (Signed)
 S/P Cervical cold knife conization, endocervical curettage 04/18/24 with Dr.Newton today  Pt is tearful and asking to speak to Vira Grieves NP. Pt states she has been home from her procedure since about 11:15 this morning. She is having vaginal burning inside and out. Reports 10/10 on pain scale. The burning is somewhat constant .She has taken (1) Tramadol , 1,000mg  of Ibuprofen  and 1,000mg  of Tylenol . Nothing is helping. Reports no vaginal bleeding, urinating fine. She states no one told her she would feel this way.   Aware I will send provider a message and will call her back with advice/recommendations.

## 2024-04-18 NOTE — Interval H&P Note (Signed)
 History and Physical Interval Note:  04/18/2024 7:19 AM  Kari Reilly  has presented today for surgery, with the diagnosis of CERVICAL DYSPLASIA.  The various methods of treatment have been discussed with the patient and family. After consideration of risks, benefits and other options for treatment, the patient has consented to  Procedure(s): CONE BIOPSY, CERVIX (N/A) as a surgical intervention.  The patient's history has been reviewed, patient examined, no change in status, stable for surgery.  I have reviewed the patient's chart and labs.  Questions were answered to the patient's satisfaction.     [name]

## 2024-04-18 NOTE — Anesthesia Postprocedure Evaluation (Signed)
 Anesthesia Post Note  Patient: Kari Reilly  Procedure(s) Performed: CERVICAL CONE BIOPSY, ENDOCERVICAL CURETTAGE     Patient location during evaluation: PACU Anesthesia Type: General Level of consciousness: awake and alert Pain management: pain level controlled Vital Signs Assessment: post-procedure vital signs reviewed and stable Respiratory status: spontaneous breathing, nonlabored ventilation and respiratory function stable Cardiovascular status: blood pressure returned to baseline Postop Assessment: no apparent nausea or vomiting Anesthetic complications: no   No notable events documented.  Last Vitals:  Vitals:   04/18/24 0926 04/18/24 0930  BP:  (!) 128/95  Pulse: (!) 104 71  Resp: 19 20  Temp:    SpO2: 92% 100%                Rayfield Cairo

## 2024-04-18 NOTE — Transfer of Care (Signed)
 Immediate Anesthesia Transfer of Care Note  Patient: Kari Reilly  Procedure(s) Performed: CERVICAL CONE BIOPSY, ENDOCERVICAL CURETTAGE  Patient Location: PACU  Anesthesia Type:General  Level of Consciousness: awake and alert   Airway & Oxygen Therapy: Patient Spontanous Breathing and Patient connected to nasal cannula oxygen  Post-op Assessment: Report given to RN and Post -op Vital signs reviewed and stable  Post vital signs: Reviewed and stable  Last Vitals:  Vitals Value Taken Time  BP 115/70 04/18/24 0842  Temp    Pulse 78 04/18/24 0843  Resp 14 04/18/24 0843  SpO2 100 % 04/18/24 0843  Vitals shown include unfiled device data.  Last Pain:  Vitals:   04/18/24 0604  TempSrc:   PainSc: 0-No pain      Patients Stated Pain Goal: 5 (04/18/24 0556)  Complications: No notable events documented.

## 2024-04-19 ENCOUNTER — Encounter (HOSPITAL_COMMUNITY): Payer: Self-pay | Admitting: Psychiatry

## 2024-04-19 ENCOUNTER — Telehealth: Payer: Self-pay | Admitting: *Deleted

## 2024-04-19 LAB — SURGICAL PATHOLOGY

## 2024-04-19 NOTE — Telephone Encounter (Addendum)
 Spoke with Ms. Borchers this morning. She states she is eating, drinking and urinating well. She has not had a BM yet but is passing gas. She is taking senokot as prescribed and encouraged her to drink plenty of water. She denies fever or chills. Patient states she is still experiencing some urethra burning believed from the catheter insertion during surgery but it has improved since yesterday.  Pt denies vaginal pain, discharge and or bleeding. Advised patient the urethra burning will improve and if it doesn't to call the office back. Pt states she spoke with Dr. Daisey Dryer last evening when she called the after hours  line and reinforced this as well. She rates her pain 4/10. Her pain is controlled with tylenol  and ibuprofen . Pt states she only needed to take one tramadol  yesterday during the intense burning and then her last tramadol  was this morning at 0400.    Instructed to call office with any fever, chills, purulent drainage, uncontrolled pain or any other questions or concerns. Patient verbalizes understanding.   Pt aware of post op appointments as well as the office number [phone] and after hours number [phone] to call if she has any questions or concerns

## 2024-04-20 ENCOUNTER — Telehealth: Payer: Self-pay | Admitting: *Deleted

## 2024-04-20 ENCOUNTER — Encounter: Payer: Self-pay | Admitting: Gynecologic Oncology

## 2024-04-20 NOTE — Progress Notes (Signed)
 Anesthesia note: Pt called GYN clinic complaining of blurry vision. The clinic thought it was related to her migraine she was having, but the patient disagreed. Someone from the GYN clinic called short stay and asked me about her vision. I looked up her chart and saw she was given transdermal scopolamine. I called the patient and told her that was the likely culprit. She stated that she pulled that off last night and vision has been improving throughout the day. I asked about other symptoms and encouraged her to seek evaluation if anything began to worsen or has new symptoms. I told her I feel confident that the scopolamine patch was definitely the issue, but to contact us  with any questions or other issues.

## 2024-04-20 NOTE — Telephone Encounter (Addendum)
 Spoke with Kari Reilly and relayed message from Vira Grieves, NP that recommendations would be to contact patient's neurologist if she continues to experience blurred vision. Pt states she has contacted her neurologist and will wait to hear back. Advised patient if her symptoms worsen and she hasn't heard back to reach back out to our office also advised patient that the office will reach out to anesthesia for possible additional recommendations. Pt verbalized understanding and thanked the office.

## 2024-04-20 NOTE — Telephone Encounter (Signed)
 Spoke with Kari Reilly this morning after she left message for Dr. Daisey Dryer in MyChart. Pt states she has been experiencing blurred vision that started on Tuesday. Asked patient if she currently experiencing a headache? And is she still taking her tramadol ? Pt states she is no longer taking her tramadol  and has history of chronic migraines and currently has had a migraine for three weeks now and is on a cocktail of medications including prednisone . Advised patient to call her neurologist and follow up in regards to blurred vision.   Pt is also asking about her pathology report and noticed it has resulted in MyChart. Wants to know when Dr. Daisey Dryer will review it? Pt advised that her message will be relayed to provider. Pt thanked the office for calling.

## 2024-04-21 NOTE — Telephone Encounter (Signed)
 Spoke with Kari Reilly who states that anesthesia called the patient yesterday and told her to take her scopolamine patch off that she still had on from her surgery on Tuesday. Pt removed the patch and her symptoms of blurred vision have resolved. Pt thanked the office for calling back.

## 2024-04-22 ENCOUNTER — Ambulatory Visit: Payer: Self-pay | Admitting: Psychiatry

## 2024-04-23 ENCOUNTER — Ambulatory Visit

## 2024-04-28 ENCOUNTER — Other Ambulatory Visit: Payer: Self-pay | Admitting: Gynecologic Oncology

## 2024-04-28 ENCOUNTER — Inpatient Hospital Stay: Attending: Psychiatry

## 2024-04-28 ENCOUNTER — Telehealth: Payer: Self-pay | Admitting: *Deleted

## 2024-04-28 DIAGNOSIS — R197 Diarrhea, unspecified: Secondary | ICD-10-CM | POA: Insufficient documentation

## 2024-04-28 DIAGNOSIS — D069 Carcinoma in situ of cervix, unspecified: Secondary | ICD-10-CM | POA: Insufficient documentation

## 2024-04-28 DIAGNOSIS — N898 Other specified noninflammatory disorders of vagina: Secondary | ICD-10-CM

## 2024-04-28 DIAGNOSIS — B3731 Acute candidiasis of vulva and vagina: Secondary | ICD-10-CM | POA: Diagnosis not present

## 2024-04-28 DIAGNOSIS — R300 Dysuria: Secondary | ICD-10-CM | POA: Insufficient documentation

## 2024-04-28 DIAGNOSIS — Z9889 Other specified postprocedural states: Secondary | ICD-10-CM | POA: Insufficient documentation

## 2024-04-28 LAB — URINALYSIS, COMPLETE (UACMP) WITH MICROSCOPIC
Bacteria, UA: NONE SEEN
Bilirubin Urine: NEGATIVE
Glucose, UA: NEGATIVE mg/dL
Ketones, ur: NEGATIVE mg/dL
Nitrite: NEGATIVE
Protein, ur: 30 mg/dL — AB
Specific Gravity, Urine: 1.029 (ref 1.005–1.030)
WBC, UA: >50 WBC/hpf (ref 0–5)
pH: 5 (ref 5.0–8.0)

## 2024-04-28 LAB — CBC (CANCER CENTER ONLY)
HCT: 41.4 % (ref 36.0–46.0)
Hemoglobin: 14.5 g/dL (ref 12.0–15.0)
MCH: 31.1 pg (ref 26.0–34.0)
MCHC: 35 g/dL (ref 30.0–36.0)
MCV: 88.8 fL (ref 80.0–100.0)
Platelet Count: 265 10*3/uL (ref 150–400)
RBC: 4.66 MIL/uL (ref 3.87–5.11)
RDW: 11.9 % (ref 11.5–15.5)
WBC Count: 7.2 10*3/uL (ref 4.0–10.5)
nRBC: 0 % (ref 0.0–0.2)

## 2024-04-28 NOTE — Telephone Encounter (Signed)
 Spoke with Ms. Galiano and relayed message from Vira Grieves, NP that we have ordered a urinalysis and CBC blood work and lab appointment has been made for this morning. Pt verbalized understanding and agreed to lab appt. At 1100 today. Pt also advised her appt. With Dr. Daisey Dryer has been moved up to 3:15 on Monday, June 16 th.

## 2024-04-28 NOTE — Telephone Encounter (Signed)
 Spoke with patient who called the office stating, I think I have an infection Pt is experiencing a foul smell from her vagina. Pt states the vaginal discharge is dark like coffee grounds. Advised this is a normal discharge after her procedure on 6/3. Pt also states she just had her period as well. Pt denies fever, chills and or pain. She is also complaining of burning on urination but not all the time. Advised patient that her message would be relayed to providers and the office would call back with recommendations.

## 2024-04-28 NOTE — Telephone Encounter (Signed)
 Spoke with Ms. Fitzgibbon and relayed normal lab results and urinalysis showed increased leukocytes (from the bloody discharge) a urine culture has been sent and this may take 24-48 hours to result. Advised patient if she develops any new symptoms to call the after hours number. Pt verbalized understanding and reminded of her appt. With Dr. Daisey Dryer on Monday 6/16 at 3:15 pm.

## 2024-05-01 ENCOUNTER — Encounter: Payer: Self-pay | Admitting: Psychiatry

## 2024-05-01 ENCOUNTER — Inpatient Hospital Stay: Admitting: Psychiatry

## 2024-05-01 VITALS — BP 123/80 | HR 117 | Temp 98.4°F | Resp 17 | Ht 64.0 in | Wt 186.2 lb

## 2024-05-01 DIAGNOSIS — D069 Carcinoma in situ of cervix, unspecified: Secondary | ICD-10-CM

## 2024-05-01 DIAGNOSIS — R300 Dysuria: Secondary | ICD-10-CM

## 2024-05-01 DIAGNOSIS — R197 Diarrhea, unspecified: Secondary | ICD-10-CM

## 2024-05-01 DIAGNOSIS — Z7189 Other specified counseling: Secondary | ICD-10-CM

## 2024-05-01 LAB — URINE CULTURE: Culture: 40000 — AB

## 2024-05-01 MED ORDER — FLUCONAZOLE 150 MG PO TABS: 150.0000 mg | ORAL_TABLET | Freq: Every day | ORAL | 0 refills | Status: DC

## 2024-05-01 MED ORDER — AMOXICILLIN-POT CLAVULANATE 875-125 MG PO TABS: 1.0000 | ORAL_TABLET | Freq: Two times a day (BID) | ORAL | 0 refills | Status: AC

## 2024-05-01 NOTE — Patient Instructions (Signed)
 It was a pleasure to see you in clinic today. - Prescription sent to pharmacy. Take 2x daily for 1 week. - If no improvement in urinary or bowel symptoms after completion of treatment, let us  know. - Return visit planned for 18mo  Thank you very much for allowing me to provide care for you today.  I appreciate your confidence in choosing our Gynecologic Oncology team at [facility].  If you have any questions about your visit today please call our office or send us  a MyChart message and we will get back to you as soon as possible.

## 2024-05-01 NOTE — Progress Notes (Signed)
 Gynecologic Oncology Return Clinic Visit  Date of Service: 05/01/2024 Referring Provider: Glinda Lapping, CNM   Assessment & Plan: Kari Reilly is a 28 y.o. woman with recurrent high grade cervical dysplasia who is s/p CKC, ECC on 04/18/24.  Postop: - Ongoing postoperative expectations and precautions reviewed.   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 - Recommend repeat pap and colpo in 231mo.  Dysuria: - Ucx with 40k proteus, 20k klebsiella - Given so symptomatic will treat - Augmentin  seems to cover both bacteria. Rx sent. Diflucan  sent to cover for yeast infection as pt reports this frequently happens  Diarrhea: - Unclear etiology - Maybe colitis - Will see if any change with 7 day course of augment as above - If no improvement, will consider referral to GI   RTC 231mo.  Derrel Flies, MD Gynecologic Oncology   Medical Decision Making I personally spent  TOTAL 25 minutes face-to-face and non-face-to-face in the care of this patient, which includes all pre, intra, and post visit time on the date of service. The discussion of cervical dysplasia is beyond the scope of routine postoperative care.   ----------------------- Reason for Visit: Postop/treatment counseling  Treatment History: 2023: HSIL pap, copo CIN2-3, ECC neg 08/27/22: LEEP CIN2-3, endo margins+ 02/2023 LSIL 03/15/23: colpo CIN1, neg ECC 10/06/2023: Pap HSIL 10/18/2023: Colpo CIN2-3 > offered referral to [facility] but out of network 02/07/24: HSIL, HPV HR pos (not 16/18/45) 02/17/24: Colpo CIN2 at 6 and 12 o'clock, CIN3 on ECC  Interval History: Not having persistent pain at baseline but having occasional shooting pains.   Still having burning with urination. Having trouble keeping food down, comes back up and has diarrhea every time she eats. Stools are brown or yellowish. Not  black or bloody. Was previously more prone to constipation. Denies RUQ pain. Reports more so if has pain it is more on the left upper side.  Tried azo for pain with urination and that helped. Restarted having burning with peeing Wednesday.     Past Medical/Surgical History: Past Medical History:  Diagnosis Date   Anxiety    Asthma    GERD (gastroesophageal reflux disease)    Headache    Migraine    PCOS (polycystic ovarian syndrome)    Pneumonia    POTS (postural orthostatic tachycardia syndrome)    Tachycardia    Vaginal Pap smear, abnormal     Past Surgical History:  Procedure Laterality Date   ADENOIDECTOMY     CERVICAL CONIZATION W/BX N/A 04/18/2024   Procedure: CERVICAL CONE BIOPSY, ENDOCERVICAL CURETTAGE;  Surgeon: Derrel Flies, MD;  Location: [facility];  Service: Gynecology;  Laterality: N/A;   IR ANGIOGRAM FOLLOW UP STUDY     LASIK Bilateral    LEEP  08/2022   TYMPANOSTOMY TUBE PLACEMENT      Family History  Problem Relation Age of Onset   Anemia Mother    Melanoma Mother    Breast cancer Maternal Grandmother    Colon cancer Paternal Grandfather    Prostate cancer Neg Hx    Pancreatic cancer Neg Hx    Ovarian cancer Neg Hx    Endometrial cancer Neg Hx     Social History   Socioeconomic History   Marital status: Divorced    Spouse name: Not on file   Number of children: 0   Years of education: 16   Highest education level: Not on file  Occupational History   Not on file  Tobacco Use   Smoking status: Never    Passive exposure: Current   Smokeless tobacco: Never  Vaping Use   Vaping status: Never Used  Substance and Sexual Activity   Alcohol use: Yes    Comment: rare   Drug use: No   Sexual activity: Yes    Birth control/protection: None  Other Topics Concern   Not on file  Social History Narrative   Right handed   Lives with alone   Currently working   One floor home   Drinks caffeine prn   Social Drivers of Health   Financial  Resource Strain: Low Risk  (02/07/2024)   Overall Financial Resource Strain (CARDIA)    Difficulty of Paying Living Expenses: Not hard at all  Food Insecurity: No Food Insecurity (02/07/2024)   Hunger Vital Sign    Worried About Running Out of Food in the Last Year: Never true    Ran Out of Food in the Last Year: Never true  Transportation Needs: No Transportation Needs (02/07/2024)   PRAPARE - Administrator, Civil Service (Medical): No    Lack of Transportation (Non-Medical): No  Physical Activity: Insufficiently Active (02/07/2024)   Exercise Vital Sign    Days of Exercise per Week: 3 days    Minutes of Exercise per Session: 40 min  Stress: Stress Concern Present (02/07/2024)   [employer] of Occupational Health - Occupational Stress Questionnaire    Feeling of Stress : Rather much  Social Connections: Socially Isolated (02/07/2024)   Social Connection and Isolation Panel    Frequency of Communication with Friends and Family: More than three times a week    Frequency of Social Gatherings with Friends and Family: Once a week    Attends Religious Services: Never    Database administrator or Organizations: No    Attends Banker Meetings: Never    Marital Status: Divorced    Current Medications:  Current Outpatient Medications:    Albuterol  Sulfate (PROAIR  RESPICLICK) 108 (90 Base) MCG/ACT AEPB, Inhale 2 puffs into the lungs every 6 (six) hours as needed., Disp: 1 each, Rfl: 0   Biotin 5000 MCG TABS, Take 5,000 mcg by mouth daily., Disp: , Rfl:    cetirizine (ZYRTEC) 10 MG tablet, Take 10 mg by mouth daily., Disp: , Rfl:    cholecalciferol (VITAMIN D3) 25 MCG (1000 UNIT) tablet, Take 1,000 Units by mouth daily., Disp: , Rfl:    Coenzyme Q10 (COQ10) 100 MG CAPS, Take 100 mg by mouth daily., Disp: , Rfl:    cyclobenzaprine  (FLEXERIL ) 5 MG tablet, Take 5 mg by mouth 3 (three) times daily as needed for muscle spasms., Disp: , Rfl:    Erenumab -aooe (AIMOVIG ) 140  MG/ML SOAJ, Inject 140 mg into the skin every 28 (twenty-eight) days., Disp: 1.12 mL, Rfl: 5   ipratropium-albuterol  (DUONEB) 0.5-2.5 (3) MG/3ML SOLN, Inhale 3 mLs into the lungs every 2 (two) hours as needed (shortness of breath)., Disp: , Rfl:    metFORMIN (GLUCOPHAGE) 500 MG tablet, Take 500 mg by mouth daily., Disp: , Rfl:    ondansetron  (ZOFRAN ) 4 MG tablet, Take 1 tablet (4 mg total) by mouth 2 (two) times daily as needed., Disp: 20 tablet, Rfl: 5   pantoprazole (PROTONIX) 20 MG tablet, Take 20 mg by mouth daily as needed for heartburn., Disp: , Rfl:    Prenatal Vit-Fe Fumarate-FA (PRENATAL MULTIVITAMIN) TABS tablet, Take 1 tablet by mouth daily.,  Disp: , Rfl:    ramelteon (ROZEREM) 8 MG tablet, Take 8 mg by mouth at bedtime., Disp: , Rfl:    senna-docusate (SENOKOT-S) 8.6-50 MG tablet, Take 2 tablets by mouth at bedtime. For AFTER surgery, do not take if having diarrhea (Patient not taking: Reported on 03/27/2024), Disp: 30 tablet, Rfl: 0   tiZANidine (ZANAFLEX) 4 MG tablet, Take 4 mg by mouth every 8 (eight) hours as needed for muscle spasms., Disp: , Rfl:    topiramate  (TOPAMAX ) 25 MG tablet, TAKE 1 TABLET AT BEDTIME FOR ONE WEEK, THEN INCREASE TO 2 TABLETS AT BEDTIME., Disp: 60 tablet, Rfl: 0   traMADol  (ULTRAM ) 50 MG tablet, Take 1 tablet (50 mg total) by mouth every 6 (six) hours as needed for severe pain (pain score 7-10). For AFTER surgery only, do not take and drive, Disp: 5 tablet, Rfl: 0   ZEPBOUND 5 MG/0.5ML Pen, Inject 5 mg into the skin once a week., Disp: , Rfl:   Review of Symptoms: Complete 10-system review is negative except as above in Interval History.  Physical Exam: LMP 04/04/2024 (Approximate) Comment: POC pregnancy test (-) negative on 04/18/2024 General: Alert, oriented, no acute distress. HEENT: Normocephalic, atraumatic.  Chest: Normal work of breathing.   Abdomen: Soft, nontender.   Extremities: Grossly normal range of motion.  Warm, well perfused.  No edema  bilaterally. Skin: No rashes or lesions noted. GU: Normal appearing external genitalia without erythema, excoriation, or lesions. Urethral meatus without trauma. Speculum exam reveals well healing cervix, stay sutures in place. Exam chaperoned by Kimberly [place], CMA   Laboratory & Radiologic Studies: Surgical pathology (04/18/24): A. CERVIX, 12 O'CLOCK, CONE BIOPSY:  -High-grade squamous intraepithelial lesion (HSIL/CIN 3),  circumferential.  -Endocervical margin positive for HSIL, 12-3 o'clock quadrant.  -Exocervical margin without identified dysplasia.   B. ENDOCERVICAL, CURETTINGS:  -Endocervical epithelial and mucosal fragments without identified  dysplasia.  -Chronic endocervicitis.

## 2024-05-04 ENCOUNTER — Encounter (HOSPITAL_BASED_OUTPATIENT_CLINIC_OR_DEPARTMENT_OTHER): Payer: Self-pay

## 2024-05-04 ENCOUNTER — Other Ambulatory Visit: Payer: Self-pay

## 2024-05-04 ENCOUNTER — Emergency Department (HOSPITAL_BASED_OUTPATIENT_CLINIC_OR_DEPARTMENT_OTHER)
Admission: EM | Admit: 2024-05-04 | Discharge: 2024-05-05 | Disposition: A | Discharge status: Home / Self Care | Attending: Emergency Medicine | Admitting: Emergency Medicine

## 2024-05-04 DIAGNOSIS — N3000 Acute cystitis without hematuria: Secondary | ICD-10-CM

## 2024-05-04 DIAGNOSIS — R197 Diarrhea, unspecified: Secondary | ICD-10-CM | POA: Insufficient documentation

## 2024-05-04 DIAGNOSIS — R1010 Upper abdominal pain, unspecified: Secondary | ICD-10-CM | POA: Insufficient documentation

## 2024-05-04 DIAGNOSIS — Z91040 Latex allergy status: Secondary | ICD-10-CM | POA: Diagnosis not present

## 2024-05-04 DIAGNOSIS — R112 Nausea with vomiting, unspecified: Secondary | ICD-10-CM

## 2024-05-04 LAB — URINALYSIS, ROUTINE W REFLEX MICROSCOPIC
Bilirubin Urine: NEGATIVE
Glucose, UA: NEGATIVE mg/dL
Ketones, ur: >80 mg/dL — AB
Nitrite: NEGATIVE
Protein, ur: 30 mg/dL — AB
Specific Gravity, Urine: 1.028 (ref 1.005–1.030)
WBC, UA: >50 WBC/hpf (ref 0–5)
pH: 5.5 (ref 5.0–8.0)

## 2024-05-04 LAB — COMPREHENSIVE METABOLIC PANEL WITH GFR
ALT: 13 U/L (ref 0–44)
AST: 17 U/L (ref 15–41)
Albumin: 4.6 g/dL (ref 3.5–5.0)
Alkaline Phosphatase: 87 U/L (ref 38–126)
Anion gap: 24 — ABNORMAL HIGH (ref 5–15)
BUN: 12 mg/dL (ref 6–20)
CO2: 16 mmol/L — ABNORMAL LOW (ref 22–32)
Calcium: 9.8 mg/dL (ref 8.9–10.3)
Chloride: 99 mmol/L (ref 98–111)
Creatinine, Ser: 0.89 mg/dL (ref 0.44–1.00)
GFR, Estimated: >60 mL/min (ref >60)
Glucose, Bld: 68 mg/dL — ABNORMAL LOW (ref 70–99)
Potassium: 4.3 mmol/L (ref 3.5–5.1)
Sodium: 139 mmol/L (ref 135–145)
Total Bilirubin: 0.6 mg/dL (ref 0.0–1.2)
Total Protein: 7.6 g/dL (ref 6.5–8.1)

## 2024-05-04 LAB — CBC WITH DIFFERENTIAL/PLATELET
Abs Immature Granulocytes: 0.02 10*3/uL (ref 0.00–0.07)
Basophils Absolute: 0 10*3/uL (ref 0.0–0.1)
Basophils Relative: 0 %
Eosinophils Absolute: 0.1 10*3/uL (ref 0.0–0.5)
Eosinophils Relative: 1 %
HCT: 41.9 % (ref 36.0–46.0)
Hemoglobin: 14.7 g/dL (ref 12.0–15.0)
Immature Granulocytes: 0 %
Lymphocytes Relative: 28 %
Lymphs Abs: 2.2 10*3/uL (ref 0.7–4.0)
MCH: 31.6 pg (ref 26.0–34.0)
MCHC: 35.1 g/dL (ref 30.0–36.0)
MCV: 90.1 fL (ref 80.0–100.0)
Monocytes Absolute: 0.5 10*3/uL (ref 0.1–1.0)
Monocytes Relative: 7 %
Neutro Abs: 5 10*3/uL (ref 1.7–7.7)
Neutrophils Relative %: 64 %
Platelets: 322 10*3/uL (ref 150–400)
RBC: 4.65 MIL/uL (ref 3.87–5.11)
RDW: 12.2 % (ref 11.5–15.5)
WBC: 7.9 10*3/uL (ref 4.0–10.5)
nRBC: 0 % (ref 0.0–0.2)

## 2024-05-04 LAB — PREGNANCY, URINE: Preg Test, Ur: NEGATIVE

## 2024-05-04 LAB — CBG MONITORING, ED: Glucose-Capillary: 64 mg/dL — ABNORMAL LOW (ref 70–99)

## 2024-05-04 MED ORDER — SODIUM CHLORIDE 0.9 % IV SOLN: INTRAVENOUS | Status: DC

## 2024-05-04 MED ORDER — DEXTROSE 50 % IV SOLN
1.0000 | Freq: Once | INTRAVENOUS | Status: AC
  Administered 2024-05-04: 50 mL via INTRAVENOUS
  Filled 2024-05-04: qty 50

## 2024-05-04 MED ORDER — SODIUM CHLORIDE 0.9 % IV BOLUS
2000.0000 mL | Freq: Once | INTRAVENOUS | Status: AC
  Administered 2024-05-04: 2000 mL via INTRAVENOUS

## 2024-05-04 NOTE — ED Triage Notes (Addendum)
 Pt states she had CERVICAL CONIZATION W/BX  on 04/18/24 Every since then she has had GI issues More diarrhea then vomiting and has no appetite Feels very fatigued and states she has not been able to keep her BS above 60 today. Lost 13 lbs since sx CBG 64 in triage Drank 12oz of grape juice PTA

## 2024-05-04 NOTE — ED Provider Notes (Signed)
 [place] EMERGENCY DEPARTMENT AT [facility] Provider Note   CSN: [id number] Arrival date & time: 05/04/24  2139     Patient presents with: Abdominal Pain and Emesis   Collette B Flagler is a 28 y.o. female.   28 year old female presents with persistent diarrhea and upper abdominal pain.  Patient is on Zepbound.  States that when she eats shortly thereafter she has severe watery diarrhea.  Denies any fever or chills.  She has not had any emesis.  States that the symptoms began after she recently had GYN surgery.  Was seen by her GYN doctor this week and that visit was reviewed.  Possible UTI was started on Augmentin  but she states that she has not taken it.  Notes that she has had difficulty controlling her blood sugars.  States that she does not have an appetite but will drink juice.  Her sugar has been difficult to keep above 60.  Endorses diffuse weakness.  Denies any significant vaginal bleeding at this time       Prior to Admission medications   Medication Sig Start Date End Date Taking? Authorizing Provider  Albuterol  Sulfate (PROAIR  RESPICLICK) 108 (90 Base) MCG/ACT AEPB Inhale 2 puffs into the lungs every 6 (six) hours as needed. 04/17/18   Buena Carmine, NP  amoxicillin -clavulanate (AUGMENTIN ) 875-125 MG tablet Take 1 tablet by mouth 2 (two) times daily for 7 days. 05/01/24 05/08/24  Derrel Flies, MD  Biotin 5000 MCG TABS Take 5,000 mcg by mouth daily.    [provider]  cetirizine (ZYRTEC) 10 MG tablet Take 10 mg by mouth daily.    [provider]  cholecalciferol (VITAMIN D3) 25 MCG (1000 UNIT) tablet Take 1,000 Units by mouth daily.    [provider]  Coenzyme Q10 (COQ10) 100 MG CAPS Take 100 mg by mouth daily.    [provider]  fluconazole  (DIFLUCAN ) 150 MG tablet Take 1 tablet (150 mg total) by mouth daily. 05/01/24   Derrel Flies, MD  ipratropium-albuterol  (DUONEB) 0.5-2.5 (3) MG/3ML SOLN Inhale 3 mLs into the lungs  every 2 (two) hours as needed (shortness of breath). 08/15/20   [provider]  metFORMIN (GLUCOPHAGE) 500 MG tablet Take 500 mg by mouth daily.    [provider]  ondansetron  (ZOFRAN ) 4 MG tablet Take 1 tablet (4 mg total) by mouth 2 (two) times daily as needed. 02/09/24   Festus Hubert, Adam R, DO  pantoprazole (PROTONIX) 20 MG tablet Take 20 mg by mouth daily as needed for heartburn.    [provider]  Prenatal Vit-Fe Fumarate-FA (PRENATAL MULTIVITAMIN) TABS tablet Take 1 tablet by mouth daily.    [provider]  ramelteon (ROZEREM) 8 MG tablet Take 8 mg by mouth at bedtime. 01/18/24   [provider]  topiramate  (TOPAMAX ) 25 MG tablet TAKE 1 TABLET AT BEDTIME FOR ONE WEEK, THEN INCREASE TO 2 TABLETS AT BEDTIME. 04/14/24   Festus Hubert, Adam R, DO    Allergies: Ciprofloxacin , Latex, and Magnesium  oxide -mg supplement    Review of Systems  All other systems reviewed and are negative.   Updated Vital Signs BP (!) 127/96 (BP Location: Right Arm)   Pulse (!) 122   Temp 98 F (36.7 C)   Resp 17   Ht 1.626 m (5' 4)   Wt 81.6 kg   LMP 04/18/2024 (Exact Date)   SpO2 100%   BMI 30.90 kg/m   Physical Exam Vitals and nursing note reviewed.  Constitutional:  General: She is not in acute distress.    Appearance: Normal appearance. She is well-developed. She is not toxic-appearing.  HENT:     Head: Normocephalic and atraumatic.   Eyes:     General: Lids are normal.     Conjunctiva/sclera: Conjunctivae normal.     Pupils: Pupils are equal, round, and reactive to light.   Neck:     Thyroid: No thyroid mass.     Trachea: No tracheal deviation.   Cardiovascular:     Rate and Rhythm: Normal rate and regular rhythm.     Heart sounds: Normal heart sounds. No murmur heard.    No gallop.  Pulmonary:     Effort: Pulmonary effort is normal. No respiratory distress.     Breath sounds: Normal breath sounds. No stridor. No decreased breath sounds, wheezing,  rhonchi or rales.  Abdominal:     General: There is no distension.     Palpations: Abdomen is soft.     Tenderness: There is no abdominal tenderness. There is no rebound.   Musculoskeletal:        General: No tenderness. Normal range of motion.     Cervical back: Normal range of motion and neck supple.   Skin:    General: Skin is warm and dry.     Findings: No abrasion or rash.   Neurological:     Mental Status: She is alert and oriented to person, place, and time. Mental status is at baseline.     GCS: GCS eye subscore is 4. GCS verbal subscore is 5. GCS motor subscore is 6.     Cranial Nerves: No cranial nerve deficit.     Sensory: No sensory deficit.     Motor: Motor function is intact.   Psychiatric:        Attention and Perception: Attention normal.        Speech: Speech normal.        Behavior: Behavior normal.     (all labs ordered are listed, but only abnormal results are displayed) Labs Reviewed  CBG MONITORING, ED - Abnormal; Notable for the following components:      Result Value   Glucose-Capillary 64 (*)    All other components within normal limits  CBC WITH DIFFERENTIAL/PLATELET  URINALYSIS, ROUTINE W REFLEX MICROSCOPIC  PREGNANCY, URINE  COMPREHENSIVE METABOLIC PANEL WITH GFR    EKG: None  Radiology: No results found.   Procedures   Medications Ordered in the ED  0.9 %  sodium chloride  infusion (has no administration in time range)  sodium chloride  0.9 % bolus 2,000 mL (has no administration in time range)                                    Medical Decision Making Amount and/or Complexity of Data Reviewed Labs: ordered.  Risk Prescription drug management.   Patient to receive IV fluids here and will check laboratory studies.  Suspect that patient is having side effects from her Zepbound.  Will also add Protonix.  She will likely need to have endoscopy at some point.  Extremely low suspicion for obstruction as she has had profound  diarrhea has had no emesis.  She has no abdominal distention.  Will sign over to Dr. Lula Sale     Final diagnoses:  None    ED Discharge Orders     None  Lind Repine, MD 05/04/24 2240

## 2024-05-04 NOTE — ED Notes (Signed)
 ED Provider at bedside.

## 2024-05-05 NOTE — ED Provider Notes (Signed)
  Physical Exam  BP 118/80   Pulse (!) 114   Temp 98 F (36.7 C)   Resp 18   Ht 5' 4 (1.626 m)   Wt 81.6 kg   LMP 04/18/2024 (Exact Date)   SpO2 100%   BMI 30.90 kg/m   Physical Exam Vitals and nursing note reviewed.  Constitutional:      Appearance: She is well-developed.  HENT:     Head: Normocephalic.   Neurological:     Mental Status: She is alert.     Procedures  Procedures  ED Course / MDM    Medical Decision Making Amount and/or Complexity of Data Reviewed Labs: ordered.  Risk Prescription drug management.    Care assumed from Dr. Leighton Punches at shift change.  Patient presenting here with nausea and vomiting for the past 2 days.  She has also been running a lower than normal blood sugar.  Care signed out to me awaiting results of laboratory studies and reassessment after receiving fluids.  Patient is now feeling markedly improved and would like to go home.  I feel as the discharge is appropriate.  Patient has Zofran  at home.  She also has evidence of UTI, and has been prescribed an antibiotic already, but has not taken it due to the nausea.  She will begin this when she returns home.      Orvilla Blander, MD 05/05/24 [phone]

## 2024-05-05 NOTE — Discharge Instructions (Signed)
 Resume taking Augmentin  as previously prescribed.  Continue Zofran  as previously prescribed.  Clear liquids for the next 12 hours, then slowly advance to normal as tolerated.  Return to the ER if symptoms significantly worsen or change.

## 2024-05-08 ENCOUNTER — Other Ambulatory Visit: Payer: Self-pay | Admitting: Neurology

## 2024-05-08 MED ORDER — TOPIRAMATE 25 MG PO TABS: 75.0000 mg | ORAL_TABLET | Freq: Every day | ORAL | 5 refills | Status: DC

## 2024-05-09 ENCOUNTER — Ambulatory Visit: Payer: Self-pay

## 2024-05-09 NOTE — Telephone Encounter (Signed)
 Scheduling

## 2024-05-09 NOTE — Telephone Encounter (Signed)
 Patient needing to be contacted about scheduling with Dr Annella. See previous note.

## 2024-05-09 NOTE — Telephone Encounter (Signed)
     Patient spoke with Dr Annella yesterday. Patient wants to change providers to dr [facility] denies chest pain, syncope, airway compromise, or any other symptoms needing immediate attention.  Patient was advised by Dr Annella to send him a message about getting her in for an appointment. 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 Patient is now living in [place] and just wants to change to this Pulmonary Office. Patient denies being in any acute distress at this time and is advised that if anything changes to give us  a call and if anything worsens to go to the Emergency Room. Patient verbalized understanding. [phone] is the patient's best contact number.        Copied from CRM [id number]. Topic: Clinical - Red Word Triage >> May 09, 2024  1:23 PM Leila BROCKS wrote: Red Word that prompted transfer to Nurse Triage: Patient [id number] wants to start seeing Dr. Annella as a new patient for asthma, medications changes. Patient was seeing a pulmonologist Dr. Ashley, Dr. Dominique and PAs in [facility] in [place], [place]. Patient states worsen symptoms in the last few days shortness of breath, wheezing, dizziness, denies no pain nor fever. Please advise.   ----------------------------------------------------------------------- From previous Reason for Contact - Scheduling: Patient/patient representative is calling to schedule an appointment. Refer to attachments for appointment information. Reason for Disposition  Requesting regular office appointment  Answer Assessment - Initial Assessment Questions Patient spoke with Dr Annella yesterday. Patient wants to change providers to dr [facility] denies chest pain, syncope, airway compromise, or any other symptoms needing immediate attention.  Patient was advised by Dr Annella to send him a message about getting her in for an appointment. 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 Patient is now living in [place] and just wants to change to this Pulmonary Office. Patient denies being in any acute distress at this time and is advised that if anything changes to give us  a call and if anything worsens to go to the Emergency Room. Patient verbalized understanding. [phone] is the patient's best contact number.  Protocols used: Information Only Call - No Triage-A-AH

## 2024-05-11 ENCOUNTER — Encounter: Payer: Self-pay | Admitting: Psychiatry

## 2024-05-20 ENCOUNTER — Encounter: Payer: Self-pay | Admitting: Women's Health

## 2024-05-22 ENCOUNTER — Telehealth: Payer: Self-pay | Admitting: Psychiatry

## 2024-05-22 ENCOUNTER — Telehealth: Payer: Self-pay | Admitting: *Deleted

## 2024-05-22 NOTE — Telephone Encounter (Signed)
 Spoke with patient after leaving a message for the office to review her MyChart messages with her ob/gyn provider.   Patient states she is on a heavy period and she is passing tiny blood clots and having severe cramps. Pt reports having a history of ovarian cyst in high school but she hasn't had this issue in quite awhile.  She reports that the cramping has eased up a bit and is worried because she is going on vacation to [place]  and leaves Wednesday 7/9 in the evening.   Advised patient to call her ob/gyn provider so that she can be seen and her message would be relayed to provider and the office will call back with any new recommendations.

## 2024-05-22 NOTE — Telephone Encounter (Signed)
 Return patient call.  Patient had notified her OB/GYN office of heavier and more painful period.  She underwent a cone of conization on 04/18/2024.  She thinks she may have had a light period after the procedure but this is her first real period.  The bleeding is getting lighter but she is having also pain that she does not usually experience with her menstrual cycle.  Discussed that there can be some change in menstrual cycle initially after her procedure.  This is likely to improve with time.  She may also have more pain as her cervix heals and there may be some stenosis of the cervix.  May be helpful to perform a pelvic ultrasound if her OB/GYN office able to perform this in office pending her exam.  Otherwise, we discussed that NSAIDs may be helpful as she does not usually take anything for periods.  She was able to get scheduled for an exam with her OB/GYN tomorrow which is good.  All questions answered.

## 2024-05-23 ENCOUNTER — Encounter: Payer: Self-pay | Admitting: Adult Health

## 2024-05-23 ENCOUNTER — Other Ambulatory Visit (HOSPITAL_COMMUNITY)
Admission: RE | Admit: 2024-05-23 | Discharge: 2024-05-23 | Disposition: A | Discharge status: Home / Self Care | Source: Ambulatory Visit | Attending: Adult Health | Admitting: Adult Health

## 2024-05-23 ENCOUNTER — Ambulatory Visit: Admitting: Adult Health

## 2024-05-23 VITALS — BP 114/79 | HR 111 | Ht 65.0 in | Wt 181.0 lb

## 2024-05-23 DIAGNOSIS — N920 Excessive and frequent menstruation with regular cycle: Secondary | ICD-10-CM | POA: Diagnosis not present

## 2024-05-23 DIAGNOSIS — R102 Pelvic and perineal pain unspecified side: Secondary | ICD-10-CM | POA: Insufficient documentation

## 2024-05-23 DIAGNOSIS — Z3202 Encounter for pregnancy test, result negative: Secondary | ICD-10-CM | POA: Insufficient documentation

## 2024-05-23 DIAGNOSIS — N898 Other specified noninflammatory disorders of vagina: Secondary | ICD-10-CM | POA: Insufficient documentation

## 2024-05-23 DIAGNOSIS — Z113 Encounter for screening for infections with a predominantly sexual mode of transmission: Secondary | ICD-10-CM

## 2024-05-23 LAB — POCT URINE PREGNANCY: Preg Test, Ur: NEGATIVE

## 2024-05-23 MED ORDER — FLUCONAZOLE 150 MG PO TABS: ORAL_TABLET | ORAL | 1 refills | Status: DC

## 2024-05-23 MED ORDER — DOXYCYCLINE HYCLATE 100 MG PO TABS: 100.0000 mg | ORAL_TABLET | Freq: Two times a day (BID) | ORAL | 0 refills | Status: DC

## 2024-05-23 MED ORDER — CEFTRIAXONE SODIUM 1 G IJ SOLR
1.0000 g | Freq: Once | INTRAMUSCULAR | Status: AC
  Administered 2024-05-23: 1 g via INTRAMUSCULAR

## 2024-05-23 NOTE — Progress Notes (Signed)
 Subjective:     Patient ID: Kari Reilly, female   DOB: Feb 13, 1996, 28 y.o.   MRN: [id number]  HPI Kari Reilly is a 28 year old white female,divorced, G0P0, in complaining of abdominal pain, left side >right and heavy bleeding that has stopped and now brown discharge that gushes. She had cervical cone 04/18/24. No sex since cone.  She going to [place]   Thursday for work.  She has just finished Augmentin  for UTI.     Component Value Date/Time   DIAGPAP (A) 02/07/2024 1428    - High grade squamous intraepithelial lesion (HSIL)   HPVHIGH Positive (A) 02/07/2024 1428   ADEQPAP  02/07/2024 1428    Satisfactory for evaluation; transformation zone component PRESENT.  Colpo 02/17/24 CIN 2-3   PCP is [employer]. Review of Systems +abdominal pain, left side >right  +heavy bleeding that has stopped and now brown discharge that gushes.  Has had some back pain too, denies fever    Reviewed past medical,surgical, social and family history. Reviewed medications and allergies.  Objective:   Physical Exam BP 114/79 (BP Location: Right Arm, Patient Position: Sitting, Cuff Size: Normal)   Pulse (!) 111   Ht 5' 5 (1.651 m)   Wt 181 lb (82.1 kg)   LMP 05/15/2024 (Exact Date)   BMI 30.12 kg/m  UPT is negative Skin warm and dry.Pelvic: external genitalia is normal in appearance no lesions, vagina: watery brown discharge without odor,urethra has no lesions or masses noted, cervix:smooth, some CMT, uterus: normal size, shape and contour, + tender, no masses felt, adnexa: no masses, +tenderness noted. Bladder is non tender and no masses felt.   CV swab obtained.  [facility] - 05/23/24 1413       Pregnancy Intention Screening   Does the patient want to become pregnant in the next year? Yes    Does the patient's partner want to become pregnant in the next year? Yes    Would the patient like to discuss contraceptive options today? No      Contraception Wrap Up   Current Method Pregnant/Seeking Pregnancy     End Method Pregnant/Seeking Pregnancy    Contraception Counseling Provided No         Examination chaperoned by Clarita Salt LPN  Assessment:     1. Pregnancy examination or test, negative result - POCT urine pregnancy  2. Pelvic pain (Primary) +pain, ?PID CV swab sent  Check CBC Pelvic US  05/24/24 at 1 pm at [facility] to assess uterus and ovaries  CV swab sent  - Cervicovaginal ancillary only( [facility]) - US  PELVIC COMPLETE WITH TRANSVAGINAL; Future - CBC w/Diff - cefTRIAXone  (ROCEPHIN ) injection 1 g Discussed with Dr Jayne Will give rocephin  1 gm in office and rx doxycycline  100 mg 1 bid x 10 days and will rx diflucan   Meds ordered this encounter  Medications   fluconazole  (DIFLUCAN ) 150 MG tablet    Sig: Take 1 now and 1 in 3 days if needed    Dispense:  2 tablet    Refill:  1    Supervising Provider:   JAYNE MINDER H [[id number]]   doxycycline  (VIBRA -TABS) 100 MG tablet    Sig: Take 1 tablet (100 mg total) by mouth 2 (two) times daily.    Dispense:  20 tablet    Refill:  0    Supervising Provider:   JAYNE MINDER H [[id number]]   cefTRIAXone  (ROCEPHIN ) injection 1 g    Antibiotic Indication::   Other Indication (list below)  Other Indication::   PID    3. Menorrhagia with regular cycle Bleeding has stopped  - CBC w/Diff  4. Vaginal discharge CV swab sent for GC/CHL,trich,BV and yeast  - Cervicovaginal ancillary only( [facility])  5. Screening examination for STD (sexually transmitted disease) Check HIV and RPR - HIV Antibody (routine testing w rflx) - RPR     Plan:     Follow up in 9 days for recheck

## 2024-05-24 ENCOUNTER — Ambulatory Visit (HOSPITAL_BASED_OUTPATIENT_CLINIC_OR_DEPARTMENT_OTHER)
Admission: RE | Admit: 2024-05-24 | Discharge: 2024-05-24 | Disposition: A | Discharge status: Home / Self Care | Source: Ambulatory Visit | Attending: Adult Health | Admitting: Adult Health

## 2024-05-24 ENCOUNTER — Ambulatory Visit: Payer: Self-pay | Admitting: Adult Health

## 2024-05-24 DIAGNOSIS — R102 Pelvic and perineal pain: Secondary | ICD-10-CM | POA: Diagnosis present

## 2024-05-24 LAB — CBC WITH DIFFERENTIAL/PLATELET
Basophils Absolute: 0 x10E3/uL (ref 0.0–0.2)
Basos: 0 %
EOS (ABSOLUTE): 0 x10E3/uL (ref 0.0–0.4)
Eos: 1 %
Hematocrit: 40.4 % (ref 34.0–46.6)
Hemoglobin: 13.5 g/dL (ref 11.1–15.9)
Immature Grans (Abs): 0 x10E3/uL (ref 0.0–0.1)
Immature Granulocytes: 0 %
Lymphocytes Absolute: 2.1 x10E3/uL (ref 0.7–3.1)
Lymphs: 41 %
MCH: 31.4 pg (ref 26.6–33.0)
MCHC: 33.4 g/dL (ref 31.5–35.7)
MCV: 94 fL (ref 79–97)
Monocytes Absolute: 0.4 x10E3/uL (ref 0.1–0.9)
Monocytes: 8 %
Neutrophils Absolute: 2.7 x10E3/uL (ref 1.4–7.0)
Neutrophils: 50 %
Platelets: 239 x10E3/uL (ref 150–450)
RBC: 4.3 x10E6/uL (ref 3.77–5.28)
RDW: 13.1 % (ref 11.7–15.4)
WBC: 5.2 x10E3/uL (ref 3.4–10.8)

## 2024-05-24 LAB — RPR: RPR Ser Ql: NONREACTIVE

## 2024-05-24 LAB — HIV ANTIBODY (ROUTINE TESTING W REFLEX): HIV Screen 4th Generation wRfx: NONREACTIVE

## 2024-05-25 LAB — CERVICOVAGINAL ANCILLARY ONLY
Bacterial Vaginitis (gardnerella): NEGATIVE
Candida Glabrata: NEGATIVE
Candida Vaginitis: NEGATIVE
Chlamydia: NEGATIVE
Comment: NEGATIVE
Comment: NEGATIVE
Comment: NEGATIVE
Comment: NEGATIVE
Comment: NEGATIVE
Comment: NORMAL
Neisseria Gonorrhea: NEGATIVE
Trichomonas: NEGATIVE

## 2024-05-30 ENCOUNTER — Telehealth: Payer: Self-pay | Admitting: Internal Medicine

## 2024-05-30 NOTE — Telephone Encounter (Signed)
Patient is requesting to speak with a nurse. Please advise.

## 2024-05-30 NOTE — Telephone Encounter (Signed)
 FYI, spoke to pt. She states she has appt on 7/17 with Dr. Okey but because of the location and association with hospital charges, she has to pay a few hundred dollars out of pocket and she wishes to see Dr. Okey at [facility]. No availability, pt aware that Arlean is not present in clinic and Dr. Okey is not either, unfortunately I am unable to help her with scheduling something different that will not be costly. She will keep her appt and is aware that I will send FYI to Dr. Okey.

## 2024-05-31 NOTE — Progress Notes (Unsigned)
 Cardiology Office Note   Date:  06/01/2024   ID:  Kari Reilly, DOB 1996/03/19, MRN [id number]  PCP:  Medicine, [facility] (Inactive)  Cardiologist:   Vina Gull, MD   Pt presents for follow up of dizziness and tachycardia   History of Present Illness: Kari Reilly is a 28 y.o. female with a history of obesity, depression, bipolar d/o, POTS   Aug 2022 she started using Mounjaro for weight loss. By March 2023 she had lost close to 80 lbs    Feb 2023 she started having episodes of heart racing, dizziness  Worse in the shower   No syncope but close.  Sometimes heart racing would wake her from sleep.  At high heart rates can develop chest pressure. The pt also developed HA with the palpitations.  Bitemporal then across whole head  Nagging/dull then very severe  Headaches not affected by positon   Oct 2023  The pt was switched  from Mon[place]o to [place].   She then went off if it in Feb 2024 to June 2024 (insurance issues )  No change dizziness on or off these meds     She has been seen At [facility]   First visit in March 2023  Recomm metoprolol 12.5 bid  Patient says that made her feel very bad   Echo in March 2023 was normal  Zio patch in Feb 2023 showed SR/ST   60 to 177 bpm  Average HR 101 bpm   Rae PVCs PACs  Repeat Zio patch in 2024 showed reported SVT Seen by EP in Feb 2024 [place])  Ques ST vs inapprop ST  Underwent EP study in March 2024.  No arrhythmias were induced.   Then recomm tilt table   BP did not drop significant during study   SHe did develop tachycardia  Recomm hydration and consideration of midodrine or florinef  Not started    I saw the pt in clinic in July 2024  At that timie she was tachycardic with standing    Recomm ivabradine  for this    She did not take as plans for getting pregnant   ALso recomm MgOxide given HA    She had a reaction to this and stopped   The pt continues to have dizziness  About 6 times per week  NO syncope      Says she is getting about 80 oz of fluid in per day    Notes heart racing a lot. Sometimes extremely fast     Current Meds  Medication Sig   Albuterol  Sulfate (PROAIR  RESPICLICK) 108 (90 Base) MCG/ACT AEPB Inhale 2 puffs into the lungs every 6 (six) hours as needed.   Biotin 5000 MCG TABS Take 5,000 mcg by mouth daily.   cetirizine (ZYRTEC) 10 MG tablet Take 10 mg by mouth daily.   cholecalciferol (VITAMIN D3) 25 MCG (1000 UNIT) tablet Take 1,000 Units by mouth daily.   Coenzyme Q10 (COQ10) 100 MG CAPS Take 100 mg by mouth daily.   ipratropium-albuterol  (DUONEB) 0.5-2.5 (3) MG/3ML SOLN Inhale 3 mLs into the lungs every 2 (two) hours as needed (shortness of breath).   metFORMIN (GLUCOPHAGE) 500 MG tablet Take 500 mg by mouth daily.   ondansetron  (ZOFRAN ) 4 MG tablet Take 1 tablet (4 mg total) by mouth 2 (two) times daily as needed.   pantoprazole (PROTONIX) 20 MG tablet Take 20 mg by mouth daily as needed for heartburn.   pantoprazole (PROTONIX) 40 MG tablet  Take 40 mg by mouth daily.   Prenatal Vit-Fe Fumarate-FA (PRENATAL MULTIVITAMIN) TABS tablet Take 1 tablet by mouth daily.   ramelteon (ROZEREM) 8 MG tablet Take 8 mg by mouth at bedtime.   rizatriptan  (MAXALT -MLT) 10 MG disintegrating tablet Take by mouth.   topiramate  (TOPAMAX ) 25 MG tablet Take 3 tablets (75 mg total) by mouth at bedtime. (Patient taking differently: Take 50 mg by mouth at bedtime.)   ZEPBOUND 7.5 MG/0.5ML Pen Inject 7.5 mg into the skin.     Allergies:   Ciprofloxacin , Latex, and Magnesium  oxide -mg supplement   Past Medical History:  Diagnosis Date   Anxiety    Asthma    GERD (gastroesophageal reflux disease)    Headache    Migraine    PCOS (polycystic ovarian syndrome)    Pneumonia    POTS (postural orthostatic tachycardia syndrome)    Tachycardia    Vaginal Pap smear, abnormal     Past Surgical History:  Procedure Laterality Date   ADENOIDECTOMY     CERVICAL CONIZATION W/BX N/A 04/18/2024    Procedure: CERVICAL CONE BIOPSY, ENDOCERVICAL CURETTAGE;  Surgeon: Eldonna Mays, MD;  Location: [facility] ORS;  Service: Gynecology;  Laterality: N/A;   IR ANGIOGRAM FOLLOW UP STUDY     LASIK Bilateral    LEEP  08/2022   TYMPANOSTOMY TUBE PLACEMENT       Social History:  The patient  reports that she has never smoked. She has been exposed to tobacco smoke. She has never used smokeless tobacco. She reports current alcohol use. She reports that she does not use drugs.   Family History:  The patient's family history includes Anemia in her mother; Breast cancer in her maternal grandmother; Colon cancer in her paternal grandfather; Melanoma in her mother.    ROS:  Please see the history of present illness. All other systems are reviewed and  Negative to the above problem except as noted.    PHYSICAL EXAM: VS:  BP 108/82 (BP Location: Right Arm, Patient Position: Sitting)   Pulse 88   Ht 5' 5 (1.651 m)   Wt 179 lb 6.4 oz (81.4 kg)   LMP 05/15/2024 (Exact Date)   SpO2 98%   BMI 29.85 kg/m    BP laying    111/76 P 98  Sitting  121/86  P 109    Standing 0 min   121/86  P 144  Standing 4 min 122/93  P 125   GEN: Pt is in no acute distress  HEENT: normal  Neck: no JVD, carotid bruits Cardiac: RRR; no murmurs,   No LE edema  Respiratory:  clear to auscultation GI: soft, nontender  No hepatomegaly  MS: no deformity Moving all extremities     EKG:  EKG is not ordered    Lipid Panel No results found for: CHOL, TRIG, HDL, CHOLHDL, VLDL, LDLCALC, LDLDIRECT    Wt Readings from Last 3 Encounters:  06/01/24 179 lb 6.4 oz (81.4 kg)  05/23/24 181 lb (82.1 kg)  05/04/24 180 lb (81.6 kg)      ASSESSMENT AND PLAN:  1  POTS  Pt continues to have dizziness, tachycardia      Needs to drink more fluids and take in increased salt Discussed SPANX, elevate HOB  Could not add ivabradine   No to mestinon   as trying to get pregnant (may help symptoms ) Did not tolerate metoprolol    Will try low dose inderal      She will right in with  response to MyChart   Tentative follow up in 6 months   Will be in touch on My Chart  2  Headaches   Now seeing Dr Skeet   Improved    Current medicines are reviewed at length with the patient today.  The patient does not have concerns regarding medicines.  Signed, Vina Gull, MD  06/01/2024 10:58 AM    [facility] [address] [place], [place], [place]  [address] Phone: [phone]; Fax: [phone]

## 2024-06-01 ENCOUNTER — Ambulatory Visit: Attending: Internal Medicine | Admitting: Internal Medicine

## 2024-06-01 ENCOUNTER — Encounter: Payer: Self-pay | Admitting: Internal Medicine

## 2024-06-01 ENCOUNTER — Ambulatory Visit: Admitting: Adult Health

## 2024-06-01 VITALS — BP 108/82 | HR 88 | Ht 65.0 in | Wt 179.4 lb

## 2024-06-01 DIAGNOSIS — G90A Postural orthostatic tachycardia syndrome (POTS): Secondary | ICD-10-CM | POA: Diagnosis not present

## 2024-06-01 MED ORDER — PROPRANOLOL HCL 10 MG PO TABS: 10.0000 mg | ORAL_TABLET | Freq: Two times a day (BID) | ORAL | 6 refills | Status: DC

## 2024-06-01 NOTE — Patient Instructions (Signed)
 Medication Instructions:   Begin Propranolol  10mg  twice a day   Continue all other medications.     Labwork:  none  Testing/Procedures:  none  Follow-Up:  6 months   Any Other Special Instructions Will Be Listed Below (If Applicable).   If you need a refill on your cardiac medications before your next appointment, please call your pharmacy.

## 2024-06-15 ENCOUNTER — Telehealth: Payer: Self-pay

## 2024-06-15 ENCOUNTER — Encounter: Payer: Self-pay | Admitting: Internal Medicine

## 2024-06-15 DIAGNOSIS — K219 Gastro-esophageal reflux disease without esophagitis: Secondary | ICD-10-CM

## 2024-06-15 NOTE — Telephone Encounter (Signed)
 Kari Reilly called office asking for a referral to GI. She states she has spoken to Dr.Newton about her continuous issues with acid reflux (GERD). She is currently on the maximum dose of Protonix and also takes Pepcid with no relief. Pt states Dr.Newton told her she would put in a referral to GI if S&S got worse.   Pt is aware Dr.Newton is out of the office until 8/4.Message sent, pt will get a call back from office

## 2024-06-16 NOTE — Telephone Encounter (Signed)
 Not a common side effect fro propranolol    Could try stopping and follow  I agree with recomm for GI referral   Please set up referral for GI to assess

## 2024-06-16 NOTE — Telephone Encounter (Signed)
 Per Eleanor Epps NP, Ms.Wrench is aware of the recommendation of going to her PCP for the referral to GI.  Pt states her PCP is retiring but she will give the office a call to see someone else.

## 2024-06-19 ENCOUNTER — Other Ambulatory Visit: Payer: Self-pay | Admitting: Psychiatry

## 2024-06-19 DIAGNOSIS — K219 Gastro-esophageal reflux disease without esophagitis: Secondary | ICD-10-CM

## 2024-06-21 ENCOUNTER — Encounter: Payer: Self-pay | Admitting: Gastroenterology

## 2024-06-26 ENCOUNTER — Emergency Department (HOSPITAL_COMMUNITY)
Admission: EM | Admit: 2024-06-26 | Discharge: 2024-06-26 | Disposition: A | Discharge status: Home / Self Care | Source: Ambulatory Visit | Attending: Emergency Medicine | Admitting: Emergency Medicine

## 2024-06-26 ENCOUNTER — Emergency Department (HOSPITAL_COMMUNITY)

## 2024-06-26 ENCOUNTER — Encounter (HOSPITAL_COMMUNITY): Payer: Self-pay

## 2024-06-26 ENCOUNTER — Other Ambulatory Visit: Payer: Self-pay

## 2024-06-26 DIAGNOSIS — R112 Nausea with vomiting, unspecified: Secondary | ICD-10-CM | POA: Insufficient documentation

## 2024-06-26 DIAGNOSIS — R109 Unspecified abdominal pain: Secondary | ICD-10-CM | POA: Insufficient documentation

## 2024-06-26 DIAGNOSIS — R197 Diarrhea, unspecified: Secondary | ICD-10-CM | POA: Diagnosis not present

## 2024-06-26 DIAGNOSIS — Z91040 Latex allergy status: Secondary | ICD-10-CM | POA: Insufficient documentation

## 2024-06-26 DIAGNOSIS — J45909 Unspecified asthma, uncomplicated: Secondary | ICD-10-CM | POA: Insufficient documentation

## 2024-06-26 DIAGNOSIS — R110 Nausea: Secondary | ICD-10-CM

## 2024-06-26 LAB — COMPREHENSIVE METABOLIC PANEL WITH GFR
ALT: 10 U/L (ref 0–44)
AST: 15 U/L (ref 15–41)
Albumin: 4 g/dL (ref 3.5–5.0)
Alkaline Phosphatase: 68 U/L (ref 38–126)
Anion gap: 16 — ABNORMAL HIGH (ref 5–15)
BUN: 9 mg/dL (ref 6–20)
CO2: 19 mmol/L — ABNORMAL LOW (ref 22–32)
Calcium: 9.4 mg/dL (ref 8.9–10.3)
Chloride: 103 mmol/L (ref 98–111)
Creatinine, Ser: 0.81 mg/dL (ref 0.44–1.00)
GFR, Estimated: >60 mL/min (ref >60)
Glucose, Bld: 80 mg/dL (ref 70–99)
Potassium: 4 mmol/L (ref 3.5–5.1)
Sodium: 138 mmol/L (ref 135–145)
Total Bilirubin: 1.2 mg/dL (ref 0.0–1.2)
Total Protein: 7.2 g/dL (ref 6.5–8.1)

## 2024-06-26 LAB — CBC WITH DIFFERENTIAL/PLATELET
Abs Immature Granulocytes: 0.01 K/uL (ref 0.00–0.07)
Basophils Absolute: 0 K/uL (ref 0.0–0.1)
Basophils Relative: 0 %
Eosinophils Absolute: 0.1 K/uL (ref 0.0–0.5)
Eosinophils Relative: 1 %
HCT: 41.2 % (ref 36.0–46.0)
Hemoglobin: 14 g/dL (ref 12.0–15.0)
Immature Granulocytes: 0 %
Lymphocytes Relative: 22 %
Lymphs Abs: 1.6 K/uL (ref 0.7–4.0)
MCH: 31.3 pg (ref 26.0–34.0)
MCHC: 34 g/dL (ref 30.0–36.0)
MCV: 92.2 fL (ref 80.0–100.0)
Monocytes Absolute: 0.6 K/uL (ref 0.1–1.0)
Monocytes Relative: 8 %
Neutro Abs: 4.8 K/uL (ref 1.7–7.7)
Neutrophils Relative %: 69 %
Platelets: 301 K/uL (ref 150–400)
RBC: 4.47 MIL/uL (ref 3.87–5.11)
RDW: 12.3 % (ref 11.5–15.5)
WBC: 7.1 K/uL (ref 4.0–10.5)
nRBC: 0 % (ref 0.0–0.2)

## 2024-06-26 LAB — URINALYSIS, ROUTINE W REFLEX MICROSCOPIC
Glucose, UA: NEGATIVE mg/dL
Hgb urine dipstick: NEGATIVE
Ketones, ur: 80 mg/dL — AB
Leukocytes,Ua: NEGATIVE
Nitrite: NEGATIVE
Protein, ur: 100 mg/dL — AB
Specific Gravity, Urine: 1.028 (ref 1.005–1.030)
pH: 5 (ref 5.0–8.0)

## 2024-06-26 LAB — HCG, SERUM, QUALITATIVE: Preg, Serum: NEGATIVE

## 2024-06-26 LAB — LIPASE, BLOOD: Lipase: 32 U/L (ref 11–51)

## 2024-06-26 MED ORDER — SODIUM CHLORIDE 0.9 % IV BOLUS
1000.0000 mL | Freq: Once | INTRAVENOUS | Status: AC
  Administered 2024-06-26 (×2): 1000 mL via INTRAVENOUS

## 2024-06-26 MED ORDER — IOHEXOL 300 MG/ML  SOLN
100.0000 mL | Freq: Once | INTRAMUSCULAR | Status: AC | PRN
  Administered 2024-06-26 (×2): 100 mL via INTRAVENOUS

## 2024-06-26 MED ORDER — SUCRALFATE 1 G PO TABS
1.0000 g | ORAL_TABLET | Freq: Once | ORAL | Status: AC
  Administered 2024-06-26 (×2): 1 g via ORAL
  Filled 2024-06-26: qty 1

## 2024-06-26 MED ORDER — SUCRALFATE 1 G PO TABS: 1.0000 g | ORAL_TABLET | Freq: Three times a day (TID) | ORAL | 0 refills | Status: DC

## 2024-06-26 MED ORDER — ONDANSETRON HCL 4 MG/2ML IJ SOLN
4.0000 mg | Freq: Once | INTRAMUSCULAR | Status: AC
  Administered 2024-06-26 (×2): 4 mg via INTRAVENOUS
  Filled 2024-06-26: qty 2

## 2024-06-26 MED ORDER — PANTOPRAZOLE SODIUM 40 MG IV SOLR
40.0000 mg | Freq: Once | INTRAVENOUS | Status: AC
  Administered 2024-06-26 (×2): 40 mg via INTRAVENOUS
  Filled 2024-06-26: qty 10

## 2024-06-26 NOTE — ED Triage Notes (Signed)
 Pt reports with abdominal pain, vomiting, and diarrhea that has been going on for months. Pt has a GI consult coming up. For the past week pt has been on a clear liquid diet because eating causes pain, diarrhea, and vomiting. Pt reports losing weight due to these symptoms.

## 2024-06-26 NOTE — ED Provider Notes (Signed)
 [place] EMERGENCY DEPARTMENT AT [facility] Provider Note  CSN: [id number] Arrival date & time: 06/26/24 1424  Chief Complaint(s) Abdominal Pain  HPI Kari Reilly is a 28 y.o. female who is here today for several months of abdominal pain.  Patient reports that since having surgery on her cervix in June, she has been having increasing difficulty with vomiting, diarrhea.  She has felt as though she is not been able to keep down a meal for the last several weeks.  She has an appointment with GI scheduled for September.  Patient takes metformin for PCOS.  Was previously on Zepbound, however has not been on that medication for over 1 month.  She denies fever or chills.  No recent travel.   Past Medical History Past Medical History:  Diagnosis Date   Anxiety    Asthma    GERD (gastroesophageal reflux disease)    Headache    Migraine    PCOS (polycystic ovarian syndrome)    Pneumonia    POTS (postural orthostatic tachycardia syndrome)    Tachycardia    Vaginal Pap smear, abnormal    Patient Active Problem List   Diagnosis Date Noted   Vaginal discharge 05/23/2024   Menorrhagia with regular cycle 05/23/2024   Pelvic pain 05/23/2024   Pregnancy examination or test, negative result 05/23/2024   Cervical dysplasia 04/18/2024   History of loop electrical excision procedure (LEEP) 02/15/2024   Abnormal Pap smear of cervix 02/07/2024   PCOS (polycystic ovarian syndrome) 02/07/2024   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 Authorizing Provider  sucralfate  (CARAFATE ) 1 g tablet Take 1 tablet (1 g total) by mouth 4 (four) times daily -  with meals and at bedtime. 06/26/24  Yes Mannie Pac T, DO  Albuterol  Sulfate (PROAIR  RESPICLICK) 108 (90 Base) MCG/ACT AEPB Inhale 2 puffs into the  lungs every 6 (six) hours as needed. 04/17/18   Arloa Suzen RAMAN, NP  Biotin 5000 MCG TABS Take 5,000 mcg by mouth daily.    [provider]  cetirizine (ZYRTEC) 10 MG tablet Take 10 mg by mouth daily.    [provider]  cholecalciferol (VITAMIN D3) 25 MCG (1000 UNIT) tablet Take 1,000 Units by mouth daily.    [provider]  Coenzyme Q10 (COQ10) 100 MG CAPS Take 100 mg by mouth daily.    [provider]  ipratropium-albuterol  (DUONEB) 0.5-2.5 (3) MG/3ML SOLN Inhale 3 mLs into the lungs every 2 (two) hours as needed (shortness of breath). 08/15/20   [provider]  metFORMIN (GLUCOPHAGE) 500 MG tablet Take 500 mg by mouth daily.    [provider]  ondansetron  (ZOFRAN ) 4 MG tablet Take 1 tablet (4 mg total) by mouth 2 (two) times daily as needed. 02/09/24   Skeet Juliene SAUNDERS, DO  pantoprazole  (PROTONIX ) 20 MG tablet Take 20 mg by mouth daily as needed for heartburn.    [provider]  pantoprazole  (PROTONIX ) 40 MG tablet Take 40 mg by mouth daily. 05/22/24   [provider]  Prenatal Vit-Fe Fumarate-FA (PRENATAL MULTIVITAMIN) TABS tablet Take 1 tablet by mouth daily.    [provider]  propranolol  (INDERAL ) 10 MG tablet Take 1 tablet (10 mg total) by mouth 2 (two) times daily. 06/01/24   Okey Vina GAILS, MD  ramelteon (ROZEREM) 8 MG  tablet Take 8 mg by mouth at bedtime. 01/18/24   [provider]  rizatriptan  (MAXALT -MLT) 10 MG disintegrating tablet Take by mouth. 04/12/24   [provider]  topiramate  (TOPAMAX ) 25 MG tablet Take 3 tablets (75 mg total) by mouth at bedtime. Patient taking differently: Take 50 mg by mouth at bedtime. 05/08/24   Jaffe, Adam R, DO  ZEPBOUND 7.5 MG/0.5ML Pen Inject 7.5 mg into the skin. 05/09/24   [provider]                                                                                                                                    Past Surgical History Past  Surgical History:  Procedure Laterality Date   ADENOIDECTOMY     CERVICAL CONIZATION W/BX N/A 04/18/2024   Procedure: CERVICAL CONE BIOPSY, ENDOCERVICAL CURETTAGE;  Surgeon: Eldonna Mays, MD;  Location: [facility];  Service: Gynecology;  Laterality: N/A;   IR ANGIOGRAM FOLLOW UP STUDY     LASIK Bilateral    LEEP  08/2022   TYMPANOSTOMY TUBE PLACEMENT     Family History Family History  Problem Relation Age of Onset   Anemia Mother    Melanoma Mother    Breast cancer Maternal Grandmother    Colon cancer Paternal Grandfather    Prostate cancer Neg Hx    Pancreatic cancer Neg Hx    Ovarian cancer Neg Hx    Endometrial cancer Neg Hx     Social History Social History   Tobacco Use   Smoking status: Never    Passive exposure: Current   Smokeless tobacco: Never  Vaping Use   Vaping status: Never Used  Substance Use Topics   Alcohol use: Yes    Comment: rare   Drug use: No   Allergies Ciprofloxacin , Latex, and Magnesium  oxide -mg supplement  Review of Systems Review of Systems  Physical Exam Vital Signs  I have reviewed the triage vital signs BP 112/79   Pulse 97   Temp 97.9 F (36.6 C)   Resp 15   SpO2 99%   Physical Exam Vitals and nursing note reviewed.  Constitutional:      Appearance: She is well-developed.  Cardiovascular:     Rate and Rhythm: Normal rate.  Abdominal:     General: Abdomen is flat. There is no distension.     Palpations: Abdomen is soft.     Tenderness: There is no abdominal tenderness.  Skin:    General: Skin is warm.  Neurological:     General: No focal deficit present.     Mental Status: She is alert.     ED Results and Treatments Labs (all labs ordered are listed, but only abnormal results are displayed) Labs Reviewed  COMPREHENSIVE METABOLIC PANEL WITH GFR - Abnormal; Notable for the following components:      Result Value   CO2 19 (*)    Anion gap 16 (*)  All other components within normal limits  URINALYSIS, ROUTINE  W REFLEX MICROSCOPIC - Abnormal; Notable for the following components:   APPearance HAZY (*)    Bilirubin Urine SMALL (*)    Ketones, ur 80 (*)    Protein, ur 100 (*)    Bacteria, UA RARE (*)    All other components within normal limits  LIPASE, BLOOD  CBC WITH DIFFERENTIAL/PLATELET  HCG, SERUM, QUALITATIVE                                                                                                                          Radiology CT ABDOMEN PELVIS W CONTRAST Result Date: 06/26/2024 CLINICAL DATA:  Acute nonlocalized abdominal pain EXAM: CT ABDOMEN AND PELVIS WITH CONTRAST TECHNIQUE: Multidetector CT imaging of the abdomen and pelvis was performed using the standard protocol following bolus administration of intravenous contrast. RADIATION DOSE REDUCTION: This exam was performed according to the departmental dose-optimization program which includes automated exposure control, adjustment of the mA and/or kV according to patient size and/or use of iterative reconstruction technique. CONTRAST:  OMNIPAQUE  IOHEXOL  300 MG/ML  SOLN COMPARISON:  None Available. FINDINGS: Lower chest: No acute abnormality. Hepatobiliary: Indeterminate low-attenuation lesion within the left hepatic lobe, possibly representing a cyst or hemangioma in a patient without a history of malignancy. Liver otherwise unremarkable. Gallbladder unremarkable. Pancreas: Unremarkable Spleen: Unremarkable Adrenals/Urinary Tract: Adrenal glands are unremarkable. Kidneys are normal, without renal calculi, focal lesion, or hydronephrosis. Bladder is unremarkable. Stomach/Bowel: Stomach is within normal limits. Appendix appears normal. No evidence of bowel wall thickening, distention, or inflammatory changes. Vascular/Lymphatic: No significant vascular findings are present. No enlarged abdominal or pelvic lymph nodes. Reproductive: Uterus and bilateral adnexa are unremarkable. Other: No abdominal wall hernia or abnormality. No  abdominopelvic ascites. Musculoskeletal: No acute or significant osseous findings. IMPRESSION: 1. No acute intra-abdominal pathology identified. No definite radiographic explanation for the patient's reported symptoms. 2. Indeterminate low-attenuation lesion within the left hepatic lobe, likely a cyst or hemangioma in a patient of this age without a history of malignancy. Electronically Signed   By: Dorethia Molt M.D.   On: 06/26/2024 21:13   US  Abdomen Limited RUQ (LIVER/GB) Result Date: 06/26/2024 CLINICAL DATA:  [id number] Abdominal pain [id number] EXAM: ULTRASOUND ABDOMEN LIMITED RIGHT UPPER QUADRANT COMPARISON:  August 03, 2018 FINDINGS: Gallbladder: No gallstones. No wall thickening or pericholecystic fluid. No sonographic Murphy's sign noted by sonographer. Common bile duct: Diameter: 3 mm Liver: Normal echogenicity. No focal lesion identified. No intrahepatic biliary ductal dilation. Portal vein is patent on color Doppler imaging with normal direction of blood flow towards the liver. Right Kidney: Partially visualized. No mass. No hydronephrosis or nephrolithiasis. Other: None. IMPRESSION: No cholecystolithiasis or changes of acute cholecystitis. Electronically Signed   By: Rogelia Myers M.D.   On: 06/26/2024 18:05    Pertinent labs & imaging results that were available during my care of the patient were reviewed by me and considered in my medical decision making (see MDM  for details).  Medications Ordered in ED Medications  sucralfate  (CARAFATE ) tablet 1 g (has no administration in time range)  ondansetron  (ZOFRAN ) injection 4 mg (4 mg Intravenous Given 06/26/24 1625)  sodium chloride  0.9 % bolus 1,000 mL (0 mLs Intravenous Stopped 06/26/24 2040)  iohexol  (OMNIPAQUE ) 300 MG/ML solution 100 mL (100 mLs Intravenous Contrast Given 06/26/24 1903)  pantoprazole  (PROTONIX ) injection 40 mg (40 mg Intravenous Given 06/26/24 2051)                                                                                                                                      Procedures Procedures  (including critical care time)  Medical Decision Making / ED Course   This patient presents to the ED for concern of abdominal pain, vomiting diarrhea, this involves an extensive number of treatment options, and is a complaint that carries with it a high risk of complications and morbidity.  The differential diagnosis includes medication reaction, chronic abdominal pain, irritable bowel syndrome, less likely infectious cause, biliary insufficiency, GERD.  MDM: Patient overall well-appearing on exam, has a soft abdomen.  Blood work drawn at triage overall normal.  Will obtain imaging of the patient's abdomen pelvis, right upper quadrant ultrasound.  She does take metformin, could be contributing to her symptoms.  She had taken this previously for PCOS, has lost 80 pounds in the interim.  May not require metformin going further.  Reassessment 9:40 PM-patient's electrolytes normal.  CT imaging negative, gallbladder ultrasound also negative.  Discussed with the patient, she is going to hold her metformin.  She last used Zepbound 2 weeks ago.  She will follow-up with GI.  Prescription for Carafate  sent.   Additional history obtained:  -External records from outside source obtained and reviewed including: Chart review including previous notes, labs, imaging, consultation notes   Lab Tests: -I ordered, reviewed, and interpreted labs.   The pertinent results include:   Labs Reviewed  COMPREHENSIVE METABOLIC PANEL WITH GFR - Abnormal; Notable for the following components:      Result Value   CO2 19 (*)    Anion gap 16 (*)    All other components within normal limits  URINALYSIS, ROUTINE W REFLEX MICROSCOPIC - Abnormal; Notable for the following components:   APPearance HAZY (*)    Bilirubin Urine SMALL (*)    Ketones, ur 80 (*)    Protein, ur 100 (*)    Bacteria, UA RARE (*)    All other components within normal  limits  LIPASE, BLOOD  CBC WITH DIFFERENTIAL/PLATELET  HCG, SERUM, QUALITATIVE       Imaging Studies ordered: I ordered imaging studies including ultrasound, CT abdomen pelvis I independently visualized and interpreted imaging. I agree with the radiologist interpretation   Medicines ordered and prescription drug management: Meds ordered this encounter  Medications   ondansetron  (ZOFRAN ) injection 4 mg   sodium chloride  0.9 % bolus 1,000 mL  iohexol  (OMNIPAQUE ) 300 MG/ML solution 100 mL   pantoprazole  (PROTONIX ) injection 40 mg   sucralfate  (CARAFATE ) tablet 1 g   sucralfate  (CARAFATE ) 1 g tablet    Sig: Take 1 tablet (1 g total) by mouth 4 (four) times daily -  with meals and at bedtime.    Dispense:  120 tablet    Refill:  0    -I have reviewed the patients home medicines and have made adjustments as needed   Cardiac Monitoring: The patient was maintained on a cardiac monitor.  I personally viewed and interpreted the cardiac monitored which showed an underlying rhythm of: Normal sinus rhythm  Social Determinants of Health:  Factors impacting patients care include: Access to primary care   Reevaluation: After the interventions noted above, I reevaluated the patient and found that they have :improved  Co morbidities that complicate the patient evaluation  Past Medical History:  Diagnosis Date   Anxiety    Asthma    GERD (gastroesophageal reflux disease)    Headache    Migraine    PCOS (polycystic ovarian syndrome)    Pneumonia    POTS (postural orthostatic tachycardia syndrome)    Tachycardia    Vaginal Pap smear, abnormal       Dispostion: I considered admission for this patient, however she is appropriate for discharge.     Final Clinical Impression(s) / ED Diagnoses Final diagnoses:  Chronic nausea     @PCDICTATION @    Mannie Pac T, DO 06/26/24 2146

## 2024-06-26 NOTE — Discharge Instructions (Addendum)
 Like we discussed, your labs today were overall normal.  Your CT imaging, and ultrasound were also normal.  We discussed the hemangioma on your liver, nothing to worry about but you can discuss this with the GI doctor when you have your appointment.  Continue taking all medications as prescribed, except I recommend holding your metformin.  You may begin taking Carafate  3 times per day with meals.  Follow-up with your primary care doctor.

## 2024-07-18 ENCOUNTER — Telehealth: Payer: Self-pay

## 2024-07-18 NOTE — Telephone Encounter (Signed)
 The office received a voicemail from Kari Reilly stating she is going to cancel her follow up with Dr.Newton in December. She reports she will follow up with her OB/GYN. She is thankful for the care she has received.   Appointment cancelled for December visit with Dr.Newton. Message sent to Dr.Newton

## 2024-07-27 ENCOUNTER — Ambulatory Visit: Admitting: Internal Medicine

## 2024-08-04 ENCOUNTER — Encounter: Payer: Self-pay | Admitting: Pulmonary Disease

## 2024-08-04 ENCOUNTER — Ambulatory Visit: Admitting: Pulmonary Disease

## 2024-08-04 ENCOUNTER — Ambulatory Visit: Admitting: Gastroenterology

## 2024-08-04 VITALS — BP 120/76 | HR 92 | Temp 99.2°F | Ht 65.0 in | Wt 175.6 lb

## 2024-08-04 DIAGNOSIS — G90A Postural orthostatic tachycardia syndrome (POTS): Secondary | ICD-10-CM

## 2024-08-04 DIAGNOSIS — J4550 Severe persistent asthma, uncomplicated: Secondary | ICD-10-CM | POA: Diagnosis not present

## 2024-08-04 MED ORDER — TRELEGY ELLIPTA 200-62.5-25 MCG/ACT IN AEPB: 1.0000 | INHALATION_SPRAY | Freq: Every day | RESPIRATORY_TRACT | 12 refills | Status: DC

## 2024-08-04 NOTE — Patient Instructions (Signed)
 It is nice to see you  Lets switch Symbicort to Trelegy, stop using your Symbicort  Use Trelegy 1 puff once a day, rinse her mouth out with water after every use  Okay to use albuterol  nebulized and inhaler for rescue as you are, hopefully will see the need for albuterol  decrease with the addition of the extra bronchodilator and the Trelegy  We will get blood work today to assess candidacy for different biologic therapies in the future if needed if symptoms remain uncontrolled despite using Trelegy  Return to clinic in 3 months or sooner as needed with Dr. Annella

## 2024-08-04 NOTE — Progress Notes (Signed)
 @Patient  ID: Kari Reilly, female    DOB: [birth year], 28 y.o.   MRN: [id number]  Chief Complaint  Patient presents with   Asthma    Establish care.  Hx of asthma.  Asthma flare last week 07/27/2024.  Feels better x 2 days    Referring provider: No ref. provider found  HPI:   28 y.o. woman whom are seen for evaluation of asthma.  Multiple prior PCP notes reviewed.  Recent ED note 06/2024 reviewed.  Most recent cardiology note reviewed.  Patient is been deal with asthma for some time.  Has had multiple inhalers including Advair.  Currently on Symbicort for maintenance.  Some benefit.  But has symptoms fairly frequently not well-controlled including wheezing, chest discomfort, shortness of breath, cough.  These certainly flare worsened with viral infections.  Recent viral infection with worsened symptoms requiring frequent albuterol  use.  Most recent chest x-ray 03/2024 reviewed, clear lungs, mild hyperinflation on my review interpretation.  Reviewed serial labs demonstrates no elevation in eosinophils usually 0-100.  She reports being sent to allergist in the past and told she was allergic to nothing on testing.  However she has frequent seasonal allergy symptoms with change of seasons etc.  We discussed escalating inhaler therapy.  We discussed biologic therapy.  Need for additional testing to help us  choose the best biologic in the future if needed.  Questionaires / Pulmonary Flowsheets:   ACT:  Asthma Control Test ACT Total Score  08/04/2024  9:33 AM 15    MMRC:     No data to display          Epworth:      No data to display          Tests:   FENO:  No results found for: NITRICOXIDE  PFT:     No data to display          WALK:      No data to display          Imaging: Personally reviewed and as per EMR and discussion of this note No results found.  Lab Results: Personally reviewed CBC    Component Value Date/Time   WBC 7.1 06/26/2024 1459    RBC 4.47 06/26/2024 1459   HGB 14.0 06/26/2024 1459   HGB 13.5 05/23/2024 1527   HCT 41.2 06/26/2024 1459   HCT 40.4 05/23/2024 1527   PLT 301 06/26/2024 1459   PLT 239 05/23/2024 1527   MCV 92.2 06/26/2024 1459   MCV 94 05/23/2024 1527   MCH 31.3 06/26/2024 1459   MCHC 34.0 06/26/2024 1459   RDW 12.3 06/26/2024 1459   RDW 13.1 05/23/2024 1527   LYMPHSABS 1.6 06/26/2024 1459   LYMPHSABS 2.1 05/23/2024 1527   MONOABS 0.6 06/26/2024 1459   EOSABS 0.1 06/26/2024 1459   EOSABS 0.0 05/23/2024 1527   BASOSABS 0.0 06/26/2024 1459   BASOSABS 0.0 05/23/2024 1527    BMET    Component Value Date/Time   NA 138 06/26/2024 1459   NA 140 03/24/2024 1411   K 4.0 06/26/2024 1459   CL 103 06/26/2024 1459   CO2 19 (L) 06/26/2024 1459   GLUCOSE 80 06/26/2024 1459   BUN 9 06/26/2024 1459   BUN 19 03/24/2024 1411   CREATININE 0.81 06/26/2024 1459   CALCIUM 9.4 06/26/2024 1459   GFRNONAA >60 06/26/2024 1459   GFRAA >60 12/09/2019 2013    BNP No results found for: BNP  ProBNP No results found for: PROBNP  Specialty Problems       Pulmonary Problems   Asthma    Allergies  Allergen Reactions   Ciprofloxacin  Hives and Rash   Latex Rash   Magnesium  Oxide -Mg Supplement     Hives and itching on magnesium  oxide   Doxycycline  Nausea And Vomiting and Rash    Immunization History  Administered Date(s) Administered   PFIZER Comirnaty(Gray Top)Covid-19 Tri-Sucrose Vaccine 11/03/2019, 11/26/2019   PFIZER(Purple Top)SARS-COV-2 Vaccination 08/26/2020    Past Medical History:  Diagnosis Date   Anxiety    Asthma    GERD (gastroesophageal reflux disease)    Headache    Migraine    PCOS (polycystic ovarian syndrome)    Pneumonia    POTS (postural orthostatic tachycardia syndrome)    Tachycardia    Vaginal Pap smear, abnormal     Tobacco History: Social History   Tobacco Use  Smoking Status Never   Passive exposure: Current  Smokeless Tobacco Never   Counseling  given: Not Answered   Continue to not smoke  Outpatient Encounter Medications as of 08/04/2024  Medication Sig   Albuterol  Sulfate (PROAIR  RESPICLICK) 108 (90 Base) MCG/ACT AEPB Inhale 2 puffs into the lungs every 6 (six) hours as needed.   Biotin 5000 MCG TABS Take 5,000 mcg by mouth daily.   cetirizine (ZYRTEC) 10 MG tablet Take 10 mg by mouth daily.   cholecalciferol (VITAMIN D3) 25 MCG (1000 UNIT) tablet Take 1,000 Units by mouth daily.   Coenzyme Q10 (COQ10) 100 MG CAPS Take 100 mg by mouth daily.   Fluticasone-Umeclidin-Vilant (TRELEGY ELLIPTA ) 200-62.5-25 MCG/ACT AEPB Inhale 1 puff into the lungs daily.   ipratropium-albuterol  (DUONEB) 0.5-2.5 (3) MG/3ML SOLN Inhale 3 mLs into the lungs every 2 (two) hours as needed (shortness of breath).   metFORMIN (GLUCOPHAGE) 500 MG tablet Take 500 mg by mouth daily.   ondansetron  (ZOFRAN ) 4 MG tablet Take 1 tablet (4 mg total) by mouth 2 (two) times daily as needed.   pantoprazole  (PROTONIX ) 20 MG tablet Take 20 mg by mouth daily as needed for heartburn.   pantoprazole  (PROTONIX ) 40 MG tablet Take 40 mg by mouth daily.   Prenatal Vit-Fe Fumarate-FA (PRENATAL MULTIVITAMIN) TABS tablet Take 1 tablet by mouth daily.   propranolol  (INDERAL ) 10 MG tablet Take 1 tablet (10 mg total) by mouth 2 (two) times daily.   ramelteon (ROZEREM) 8 MG tablet Take 8 mg by mouth at bedtime.   rizatriptan  (MAXALT -MLT) 10 MG disintegrating tablet Take by mouth.   topiramate  (TOPAMAX ) 25 MG tablet Take 3 tablets (75 mg total) by mouth at bedtime.   [DISCONTINUED] sucralfate  (CARAFATE ) 1 g tablet Take 1 tablet (1 g total) by mouth 4 (four) times daily -  with meals and at bedtime.   [DISCONTINUED] ZEPBOUND 7.5 MG/0.5ML Pen Inject 7.5 mg into the skin.   No facility-administered encounter medications on file as of 08/04/2024.     Review of Systems  Review of Systems  N/a Physical Exam  BP 120/76   Pulse 92   Temp 99.2 F (37.3 C) (Oral)   Ht 5' 5 (1.651 m)    Wt 175 lb 9.6 oz (79.7 kg)   SpO2 97% Comment: room air  BMI 29.22 kg/m   Wt Readings from Last 5 Encounters:  08/04/24 175 lb 9.6 oz (79.7 kg)  06/01/24 179 lb 6.4 oz (81.4 kg)  05/23/24 181 lb (82.1 kg)  05/04/24 180 lb (81.6 kg)  05/01/24 186 lb 3.2 oz (84.5 kg)    BMI  Readings from Last 5 Encounters:  08/04/24 29.22 kg/m  06/01/24 29.85 kg/m  05/23/24 30.12 kg/m  05/04/24 30.90 kg/m  05/01/24 31.96 kg/m     Physical Exam General: Sitting in chair, no acute distress Eyes: EOMI, no icterus Neck: Supple, no JVP Pulmonary: Clear, normal work of breathing, good air excursion Cardiovascular: Regular rate and rhythm Abdomen: Nondistended MSK: No synovitis, no joint effusion Neuro: Normal gait, no weakness Psych: Normal mood, full affect   Assessment & Plan:   Severe persistent asthma: Preceding diagnosis.  Symptoms poorly controlled despite ICS/LABA therapy.  Escalate to Trelegy.  Eosinophils not elevated on serial lab work.  IgE, RAST panel for additional phenotyping today.  Continue albuterol  for rescue.  Consider addition of Airsupra in the future if this does not seem helpful.  POTS: Makes use of albuterol , beta agonist challenging.  Consider lev albuterol  in the future to see if this helps.  Okay to continue albuterol  based products for now.   Return in about 3 months (around 11/03/2024) for f/u Dr. Annella.   Donnice JONELLE Annella, MD 08/04/2024

## 2024-08-07 LAB — ALLERGEN PROFILE, PERENNIAL ALLERGEN IGE
Alternaria Alternata IgE: <0.1 kU/L
Aspergillus Fumigatus IgE: <0.1 kU/L
Aureobasidi Pullulans IgE: <0.1 kU/L
Candida Albicans IgE: <0.1 kU/L
Cat Dander IgE: <0.1 kU/L
Chicken Feathers IgE: <0.1 kU/L
Cladosporium Herbarum IgE: <0.1 kU/L
Cow Dander IgE: <0.1 kU/L
D Farinae IgE: 0.28 kU/L — AB
D Pteronyssinus IgE: 0.28 kU/L — AB
Dog Dander IgE: <0.1 kU/L
Duck Feathers IgE: <0.1 kU/L
Goose Feathers IgE: <0.1 kU/L
Mouse Urine IgE: <0.1 kU/L
Mucor Racemosus IgE: <0.1 kU/L
Penicillium Chrysogen IgE: <0.1 kU/L
Phoma Betae IgE: <0.1 kU/L
Setomelanomma Rostrat: <0.1 kU/L
Stemphylium Herbarum IgE: <0.1 kU/L

## 2024-08-07 NOTE — Progress Notes (Signed)
 Video Visit  Patient ID: Kari Reilly is a 28 y.o. (DOB 1996/10/25) female.  This video visit was completed through the doximity platform.  Patient has been advised of the limitations of physical exam due to the nature of a video visit and verbally agrees to the risks/limitations of the visit. If any worsening symptoms or lack of improvement, the patient will seek immediate medical care.    Video Visit Assessment/Plan: 28 y.o. cf with n/v abdominal pain, and fluctuant bowel habits.  She had EGD and colonoscopy recently due to these sypmtoms and an elevated fecal calprotectin; colonoscopy unremarkable and EGD with MWT requiring endoclips and biopsies equivocal for EOE - noted on symbicort at the time of endoscopy.  Since her endoscopy, approximately a week later she improved and with high-dose amine her symptoms are well-controlled with minimal nausea, no vomiting and stable bowel habits.  I remain concerned that the symptoms are due to GLP-1 therapy, though an atypical postoperative induced pathology is also possible. We will therefore plan to continue hyoscyamine 0.125 before meals.  Will plan to follow-up in 2 months with consideration for alternate therapies at that time.  Additionally in 2 months, we will reassess the need for repeat endoscopy.  While I suspect her eosinophilia was due to vomiting, she was on Symbicort which may have been subtly treating EOE.  If any symptoms remain, would have a low threshold to repeat EGD.  Recommend no further GLP-1 therapy.  Follow up in about 2 months (around 10/07/2024).   Provider Location at time of Tele-Consult: Other (Please specify) [facility]   Chief Complaint: n/v/ap/d   HPI Kari Reilly is a 28 year old female who is seen in follow-up.  Briefly she was well from a GI perspective until she had a gynecological surgery in June, after which she developed abdominal pain, nausea, vomiting, and fluctuant bowel habits.  A fecal calprotectin  showed elevation and therefore we pursued EGD and colonoscopy.  EGD revealed equivocal evidence of EOE but also evidence of esophageal injury with a Mallory-Weiss tear which was actively bleeding during her outpatient endoscopy and treated with 2 endoclips.  Colonoscopy showed mild erythema but biopsies were normal and this was felt to be prep artifact.  Vonoprazan was trialed but she felt no better.  Approximately 1 week after her bidirectional endoscopy, her symptoms have stabilized with minimal nausea, no vomiting, and more normal bowel habits.  She is taking hyoscyamine before meals.  Of note she has also stopped her GLP-1 which she has been off for the last 6 to 7 weeks.  She had been on GLP-1, off and on, for a long period of time and tolerated this well.  Of note, she has gained back 12 lbs since being off GLP1 and now feeling better from a GI perspective. She has also developed a fever for the last week and had some URI symptoms associated with this.  She reports seeing her pulmonologist 3 days ago and had clear lungs at that time.  She does not have any new GI symptoms to suggest fevers from a GI source.  Past Medical History:  Diagnosis Date  . ADHD   . Allergy   . Anxiety   . Asthma (*)   . Bulging lumbar disc   . Depression   . GERD (gastroesophageal reflux disease)   . PCOS (polycystic ovarian syndrome)     Past Surgical History:  Procedure Laterality Date  . Cardiac catheterization    . Cervical biopsy  w/  loop electrode excision  08/27/2022  . Colonoscopy  07/13/2024   patchy TC and left colon erythema, biopsies. Rejeski/GAP  . Colposcopy  10/18/2023   Vaginal and cervical  . Colposcopy  03/15/2023   vaginal and cervical  . Colposcopy  07/02/2022   Vaginal and cervical  . Esophagogastroduodenoscopy  07/13/2024   mallory weiss tear x2, clipped.  mild distal erythema in stomach. biopsied. Rejeski/GAP  . Eye surgery     lasik     Wt Readings from Last 3 Encounters:   07/13/24 157 lb (71.2 kg)  06/29/24 171 lb 3.2 oz (77.7 kg)  05/09/24 185 lb 3.2 oz (84 kg)    Lab Results  Component Value Date   WBC 6.1 08/23/2023   WBC 8.0 08/20/2017   HGB 13.4 08/23/2023   HGB 12.7 08/20/2017   Hematocrit 39.1 08/23/2023   Hematocrit 37.1 08/20/2017   Platelet Count 278 08/23/2023   Platelet Count 222 08/20/2017      Patient's Medications       * Accurate as of August 07, 2024  4:07 PM. Reflects encounter med changes as of last refresh          Continued Medications      Instructions  albuterol  sulfate HFA 108 (90 Base) MCG/ACT inhaler Commonly known as: PROVENTIL ,VENTOLIN ,PROAIR   2 puffs, Inhalation, Every 4 hours as needed   cyclobenzaprine  5 mg tablet Commonly known as: FLEXERIL   5-10 mg, With meals as needed   hyoscyamine sulfate 0.125 mg SL tablet Commonly known as: LEVSIN/SL  0.125 mg, Oral, Every 4 hours as needed   ipratropium-albuterol  0.5-2.5 mg/3 mL ML Soln nebulizer solution Commonly known as: DUONEB  USE 3 ML VIA NEBULIZER 30 MINUTES BEFORE MEALS AND AT BEDTIME   metFORMIN 500 mg tablet Commonly known as: GLUCOPHAGE  500 mg, At bedtime   ondansetron  4 mg tablet Commonly known as: ZOFRAN   4 mg, Oral, Every 8 hours as needed   pantoprazole  sodium 40 mg tablet Commonly known as: PROTONIX   40 mg, Oral, Daily   propranolol  HCl 10 mg tablet Commonly known as: INDERAL   10 mg, 2 times a day   ramelteon 8 MG tablet Commonly known as: ROZEREM  8 mg, Oral, At bedtime as needed   rizatriptan  10 MG disintegrating tablet Commonly known as: MAXALT -MLT    sucralfate  1 g tablet Commonly known as: CARAFATE   1 g, 4 times a day   SUMAtriptan  succinate 100 mg tablet Commonly known as: IMITREX   100 mg   topiramate  50 MG tablet Commonly known as: TOPAMAX   50 mg, Daily       Discontinued Medications    vonoprazan fumarate 10 mg Tabs tablet Commonly known as: VOQUEZNA Stopped by: Emeline Speck, MD          Reviewed and updated this visit by provider: None       Video Visit Objective Findings: Examination was conducted with the use of video cameras/computer monitors. Vital signs and other aspects of physical exam are limited due to the nature of this encounter.   Constitutional:  No apparent acute distress noted during the video interaction; Alert and oriented with normal mentation and verbally interactive. On January 25, 2019, the World Health Organization declared the COVID - 19 (Novel Coronavirus) viral disease to be a pandemic. As a result of this emergency, a rapidly evolving situation, practice patterns for physicians, physician assistants, and nurse practitioners are shifting to accommodate the need to treat in conjunction with unprecedent ed guidance from federal, state,  and local author ities -- which include , but are not limited to, self - quarantines and/or limiting physical proximity to others under any number of circumstances.   It is within this context (and with the understanding that this method of patient encounter is in the patient's best interest as well as the health and safety of other patients and the public) that "telehealth" is being provided for this patient encounter rather than a face - to - face visit.    This patient encounter is appropriate and reasonable under the circumstances given the patient's particular presentation at this time. The patient has been advised of the potential risks and limitations of this mode of treatment (including, but not limited to, the absence of in-person examination) and has agreed to be treated in a remote fashion in spite of them.  Any and all of the patient's/patient's family's questions on this issue have been answered, and I have made no promises or guarantees to the patient. The patient has also been advised to contact this office for worsening conditions or problems, and seek emergency medical treatment and/or call 911 if the patient deems  either necessary.    Risks, benefits, and alternatives of the medications and treatment plan prescribed today were discussed during this video interaction, and patient expressed understanding. Plan follow-up as discussed or as needed if any worsening symptoms or change in condition.   *Some images could not be shown.

## 2024-08-14 LAB — IGE: IgE (Immunoglobulin E), Serum: 8 kU/L (ref <=114)

## 2024-08-16 ENCOUNTER — Ambulatory Visit: Admitting: Neurology

## 2024-09-05 ENCOUNTER — Encounter: Payer: Self-pay | Admitting: Women's Health

## 2024-09-07 ENCOUNTER — Ambulatory Visit: Admitting: Women's Health

## 2024-09-07 ENCOUNTER — Encounter: Payer: Self-pay | Admitting: Women's Health

## 2024-09-07 ENCOUNTER — Other Ambulatory Visit: Payer: Self-pay | Admitting: Women's Health

## 2024-09-07 VITALS — BP 139/84 | HR 76 | Ht 65.0 in | Wt 188.0 lb

## 2024-09-07 DIAGNOSIS — Z8742 Personal history of other diseases of the female genital tract: Secondary | ICD-10-CM | POA: Diagnosis not present

## 2024-09-07 DIAGNOSIS — Z319 Encounter for procreative management, unspecified: Secondary | ICD-10-CM

## 2024-09-07 DIAGNOSIS — Z1331 Encounter for screening for depression: Secondary | ICD-10-CM

## 2024-09-07 MED ORDER — LETROZOLE 2.5 MG PO TABS: 2.5000 mg | ORAL_TABLET | Freq: Every day | ORAL | 2 refills | Status: DC

## 2024-09-07 NOTE — Patient Instructions (Signed)
 [facility]

## 2024-09-07 NOTE — Progress Notes (Signed)
   GYN VISIT Patient name: Kari Reilly MRN [id number]  Date of birth: 1996/10/17 Chief Complaint:   fertility consult  History of Present Illness:   Kari Reilly is a 28 y.o. G0P0000 Caucasian female being seen today to discuss getting pregnant. Has been trying w/ current partner x . He is 67, and has 3 young children. H/O PCOS, periods regular since 2023 when she lost a lot of weight. Taking metformin 500mg  at bedtime x 8yrs (PCOS). Wears [place] ring and intermittently does OPKs- both have shown ovulation. Still she is concerned she may need ovulation induction meds. Reports recent labs/including TSH w/ PCP.  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 Is concerned about prolonging childbearing. Wants to have 1 biological child, then hysterectomy.  Did try conceiving w/ previous partner x 39yrs (he had never fathered any children).   Patient's last menstrual period was 08/12/2024. The current method of family planning is none.  Last pap 02/07/24. Results were: HSIL w/ HRHPV positive: other (not 16, 18/45)     02/07/2024    1:53 PM  Depression screen PHQ 2/9  Decreased Interest 1  Down, Depressed, Hopeless 1  PHQ - 2 Score 2  Altered sleeping 2  Tired, decreased energy 3  Change in appetite 1  Feeling bad or failure about yourself  1  Trouble concentrating 3  Moving slowly or fidgety/restless 1  Suicidal thoughts 0  PHQ-9 Score 13        02/07/2024    1:53 PM  GAD 7 : Generalized Anxiety Score  Nervous, Anxious, on Edge 2  Control/stop worrying 1  Worry too much - different things 2  Trouble relaxing 2  Restless 3  Easily annoyed or irritable 2  Afraid - awful might happen 0  Total GAD 7 Score 12     Review of Systems:   Pertinent items are noted in HPI Denies fever/chills, dizziness, headaches, visual disturbances, fatigue, shortness of breath, chest pain, abdominal pain, vomiting, abnormal vaginal discharge/itching/odor/irritation, problems with periods,  bowel movements, urination, or intercourse unless otherwise stated above.  Pertinent History Reviewed:  Reviewed past medical,surgical, social, obstetrical and family history.  Reviewed problem list, medications and allergies. Physical Assessment:   Vitals:   09/07/24 1010  BP: 139/84  Pulse: 76  Weight: 188 lb (85.3 kg)  Height: 5' 5 (1.651 m)  Body mass index is 31.28 kg/m.       Physical Examination:   General appearance: alert, well appearing, and in no distress  Mental status: alert, oriented to person, place, and time  Skin: warm & dry   Cardiovascular: normal heart rate noted  Respiratory: normal respiratory effort, no distress  Abdomen: soft, non-tender   Pelvic: examination not indicated  Extremities: no edema   Chaperone: N/A  No results found for this or any previous visit (from the past 24 hours).  Assessment & Plan:  1) Desire for pregnancy, h/o PCOS> trying w/ current partner x (60yrs w/ previous partner). Can increase metformin 500mg  to BID if desires, start pnv, continue tracking cycles, take OPKs each cycle. Gave info on femara to review, if desires, let me know.   Meds: No orders of the defined types were placed in this encounter.   No orders of the defined types were placed in this encounter.   Return for 3 mth f/u w/ me in person.  Suzen JONELLE Fetters CNM, [facility] 09/07/2024 11:41 AM

## 2024-09-14 ENCOUNTER — Other Ambulatory Visit: Payer: Self-pay | Admitting: Medical Genetics

## 2024-09-14 DIAGNOSIS — Z006 Encounter for examination for normal comparison and control in clinical research program: Secondary | ICD-10-CM

## 2024-09-16 ENCOUNTER — Ambulatory Visit

## 2024-10-09 ENCOUNTER — Encounter: Payer: Self-pay | Admitting: Women's Health

## 2024-10-13 ENCOUNTER — Ambulatory Visit

## 2024-10-15 ENCOUNTER — Other Ambulatory Visit: Payer: Self-pay

## 2024-10-15 ENCOUNTER — Ambulatory Visit
Admission: RE | Admit: 2024-10-15 | Discharge: 2024-10-15 | Disposition: A | Discharge status: Home / Self Care | Attending: Physician Assistant

## 2024-10-15 VITALS — BP 126/83 | HR 82 | Temp 98.1°F | Resp 18 | Ht 65.0 in | Wt 188.1 lb

## 2024-10-15 DIAGNOSIS — J209 Acute bronchitis, unspecified: Secondary | ICD-10-CM | POA: Diagnosis not present

## 2024-10-15 DIAGNOSIS — R0981 Nasal congestion: Secondary | ICD-10-CM

## 2024-10-15 DIAGNOSIS — J4541 Moderate persistent asthma with (acute) exacerbation: Secondary | ICD-10-CM

## 2024-10-15 MED ORDER — PREDNISONE 20 MG PO TABS: ORAL_TABLET | ORAL | 0 refills | Status: DC

## 2024-10-15 NOTE — ED Provider Notes (Signed)
 [facility]    CSN: [id number] Arrival date & time: 10/15/24  1100      History   Chief Complaint Chief Complaint  Patient presents with   URI    Entered by patient    HPI Kari Reilly is a 28 y.o. female.  has a past medical history of Anxiety, Asthma, GERD (gastroesophageal reflux disease), Headache, Migraine, PCOS (polycystic ovarian syndrome), Pneumonia, POTS (postural orthostatic tachycardia syndrome), Tachycardia, and Vaginal Pap smear, abnormal.   HPI  Discussed the use of AI scribe software for clinical note transcription with the patient, who gave verbal consent to proceed.  The patient, with asthma and POTS, presents with worsening respiratory symptoms and shortness of breath.  The patient has been experiencing worsening respiratory symptoms since Wednesday night, accompanied by chills, severe body aches, and a migraine. Their condition has deteriorated over the past few days, with increasing shortness of breath and chest tightness. They have been using their inhaler approximately every four hours, which provides some relief.  They have tested negative for COVID-19 twice, once on Friday and again this morning. They describe having green nasal discharge and significant chest pressure. Initially, they had a sore throat, which has since resolved. They have not experienced any cough.  Their children and grandparents are also sick, and they are planning to travel out of town in a few days. They work around sick patients frequently.  They have not checked for a fever despite experiencing chills. No nausea, vomiting, diarrhea, or rashes. They mention having POTS, which causes their heart rate to increase significantly, exacerbating their overall discomfort.   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   Family history of malignant melanoma 02/07/2024   Asthma 02/07/2024   ADHD 02/07/2024    Past Surgical History:  Procedure Laterality Date   ADENOIDECTOMY     CERVICAL CONIZATION W/BX N/A 04/18/2024   Procedure: CERVICAL CONE BIOPSY, ENDOCERVICAL CURETTAGE;  Surgeon: Eldonna Mays, MD;  Location: [facility];  Service: Gynecology;  Laterality: N/A;   IR ANGIOGRAM FOLLOW UP STUDY     LASIK Bilateral    LEEP  08/2022   TYMPANOSTOMY TUBE PLACEMENT      OB History     Gravida  0   Para  0   Term  0   Preterm  0   AB  0   Living  0      SAB  0   IAB  0   Ectopic  0   Multiple  0   Live Births  0            Home Medications    Prior to Admission medications   Medication Sig Start Date End Date Taking? Authorizing Provider  letrozole  (FEMARA ) 2.5 MG tablet Take 1 tablet (2.5 mg total) by mouth daily. On days 3-7  of cycle 09/07/24   Kizzie Suzen SAUNDERS, CNM  predniSONE  (DELTASONE ) 20 MG tablet Take 60mg  PO daily x 2 days, then40mg  PO daily x 2 days, then 20mg  PO daily x 3 days 10/15/24  Yes Rhealynn Myhre E, PA-C  Albuterol  Sulfate (PROAIR  RESPICLICK) 108 (90 Base) MCG/ACT AEPB Inhale 2 puffs into the lungs every 6 (six) hours as needed. 04/17/18   Arloa Suzen RAMAN, NP  Biotin 5000 MCG TABS Take 5,000 mcg by mouth daily.    [provider]  cetirizine (ZYRTEC) 10 MG tablet Take 10 mg by mouth daily.    [provider]  cholecalciferol (VITAMIN D3) 25 MCG (1000 UNIT) tablet Take 1,000 Units by mouth daily.     [provider]  Coenzyme Q10 (COQ10) 100 MG CAPS Take 100 mg by mouth daily.    [provider]  Fluticasone-Umeclidin-Vilant (TRELEGY ELLIPTA ) 200-62.5-25 MCG/ACT AEPB Inhale 1 puff into the lungs daily. Patient not taking: Reported on 09/07/2024 08/04/24   Hunsucker, Donnice SAUNDERS, MD  ipratropium-albuterol  (DUONEB) 0.5-2.5 (3) MG/3ML SOLN Inhale 3 mLs into the lungs every 2 (two) hours as needed (shortness of breath). Patient not taking: Reported on 09/07/2024 08/15/20   [provider]  metFORMIN (GLUCOPHAGE) 500 MG tablet Take 500 mg by mouth daily.    [provider]  ondansetron  (ZOFRAN ) 4 MG tablet Take 1 tablet (4 mg total) by mouth 2 (two) times daily as needed. Patient not taking: Reported on 09/07/2024 02/09/24   Skeet Juliene SAUNDERS, DO  pantoprazole  (PROTONIX ) 20 MG tablet Take 20 mg by mouth daily as needed for heartburn. Patient not taking: Reported on 09/07/2024    [provider]  pantoprazole  (PROTONIX ) 40 MG tablet Take 40 mg by mouth daily. 05/22/24   [provider]  Prenatal Vit-Fe Fumarate-FA (PRENATAL MULTIVITAMIN) TABS tablet Take 1 tablet by mouth daily.    [provider]  propranolol  (INDERAL ) 10 MG tablet Take 1 tablet (10 mg total) by mouth 2 (two) times daily. Patient not taking: Reported on 09/07/2024 06/01/24   Okey Vina GAILS, MD  ramelteon (ROZEREM) 8 MG tablet Take 8 mg by mouth at bedtime. Patient not taking: Reported on 09/07/2024 01/18/24   [provider]  rizatriptan  (MAXALT -MLT) 10 MG disintegrating tablet Take by mouth. Patient not taking: Reported on 09/07/2024 04/12/24   [provider]  topiramate  (TOPAMAX ) 25 MG tablet Take 3 tablets (75 mg total) by mouth at bedtime. 05/08/24   Skeet Juliene SAUNDERS, DO    Family History Family History  Problem Relation Age of Onset   Anemia Mother    Melanoma Mother    Breast cancer Maternal Grandmother    Colon cancer Paternal Grandfather    Prostate  cancer Neg Hx    Pancreatic cancer Neg Hx    Ovarian cancer Neg Hx    Endometrial cancer Neg Hx     Social History Social History   Tobacco Use   Smoking status: Never    Passive exposure: Current   Smokeless tobacco: Never  Vaping Use   Vaping status: Never Used  Substance Use Topics   Alcohol use: Yes    Comment: rare   Drug use: No     Allergies   Ciprofloxacin , Latex, Magnesium  oxide -mg supplement, and Doxycycline    Review of Systems Review of Systems  Constitutional:  Positive for chills and fatigue. Negative for fever.  HENT:  Positive for congestion, postnasal drip, rhinorrhea, sinus pressure and sinus pain. Negative for ear pain and  sore throat.   Respiratory:  Positive for chest tightness and shortness of breath. Negative for cough.   Gastrointestinal:  Negative for diarrhea, nausea and vomiting.  Musculoskeletal:  Positive for myalgias.  Skin:  Negative for rash.  Neurological:  Positive for headaches.     Physical Exam Triage Vital Signs ED Triage Vitals  Encounter Vitals Group     BP 10/15/24 1118 126/83     Girls Systolic BP Percentile --      Girls Diastolic BP Percentile --      Boys Systolic BP Percentile --      Boys Diastolic BP Percentile --      Pulse Rate 10/15/24 1118 82     Resp 10/15/24 1118 18     Temp 10/15/24 1118 98.1 F (36.7 C)     Temp Source 10/15/24 1118 Oral     SpO2 10/15/24 1118 98 %     Weight 10/15/24 1119 188 lb 0.8 oz (85.3 kg)     Height 10/15/24 1119 5' 5 (1.651 m)     Head Circumference --      Peak Flow --      Pain Score 10/15/24 1119 0     Pain Loc --      Pain Education --      Exclude from Growth Chart --    No data found.  Updated Vital Signs BP 126/83 (BP Location: Right Arm)   Pulse 82   Temp 98.1 F (36.7 C) (Oral)   Resp 18   Ht 5' 5 (1.651 m)   Wt 188 lb 0.8 oz (85.3 kg)   LMP 10/05/2024 (Exact Date)   SpO2 98%   BMI 31.29 kg/m   Visual Acuity Right Eye Distance:   Left Eye  Distance:   Bilateral Distance:    Right Eye Near:   Left Eye Near:    Bilateral Near:     Physical Exam Vitals reviewed.  Constitutional:      General: She is awake.     Appearance: Normal appearance. She is well-developed and well-groomed.  HENT:     Head: Normocephalic and atraumatic.     Right Ear: Hearing, tympanic membrane and ear canal normal.     Left Ear: Hearing, tympanic membrane and ear canal normal.     Mouth/Throat:     Lips: Pink.     Mouth: Mucous membranes are moist.     Pharynx: Oropharynx is clear. Uvula midline. No pharyngeal swelling, oropharyngeal exudate, posterior oropharyngeal erythema, uvula swelling or postnasal drip.  Cardiovascular:     Rate and Rhythm: Normal rate and regular rhythm.     Pulses: Normal pulses.          Radial pulses are 2+ on the right side and 2+ on the left side.     Heart sounds: Normal heart sounds. No murmur heard.    No friction rub. No gallop.  Pulmonary:     Effort: Pulmonary effort is normal.     Breath sounds: Normal breath sounds. No decreased air movement. No decreased breath sounds, wheezing, rhonchi or rales.  Musculoskeletal:     Cervical back: Normal range of motion and neck supple.  Lymphadenopathy:     Head:     Right side of head: No submental, submandibular or preauricular adenopathy.     Left side of head: No submental, submandibular or preauricular adenopathy.     Cervical:     Right cervical: No superficial cervical adenopathy.    Left  cervical: No superficial cervical adenopathy.     Upper Body:     Right upper body: No supraclavicular adenopathy.     Left upper body: No supraclavicular adenopathy.  Skin:    General: Skin is warm and dry.  Neurological:     General: No focal deficit present.     Mental Status: She is alert and oriented to person, place, and time.  Psychiatric:        Mood and Affect: Mood normal.        Behavior: Behavior normal. Behavior is cooperative.        Thought Content:  Thought content normal.        Judgment: Judgment normal.      UC Treatments / Results  Labs (all labs ordered are listed, but only abnormal results are displayed) Labs Reviewed - No data to display  EKG   Radiology No results found.  Procedures Procedures (including critical care time)  Medications Ordered in UC Medications - No data to display  Initial Impression / Assessment and Plan / UC Course  I have reviewed the triage vital signs and the nursing notes.  Pertinent labs & imaging results that were available during my care of the patient were reviewed by me and considered in my medical decision making (see chart for details).      Final Clinical Impressions(s) / UC Diagnoses   Final diagnoses:  Acute bronchitis, unspecified organism  Nasal congestion   Asthma with acute exacerbation Acute exacerbation of asthma with increased use of rescue inhaler every four hours, shortness of breath, and chest tightness. Symptoms have worsened since Wednesday night. Current treatment with Trelegy is insufficient. POTS complicates treatment due to increased heart rate with inhaler use. - Prescribed a course of oral steroids to manage acute exacerbation.  Acute upper respiratory infection Symptoms include chills, body aches, migraine, and green nasal discharge. Negative COVID tests. Likely viral etiology, possibly triggered by bronchitis or lung inflammation. Family members are also sick, suggesting a contagious viral infection.    Discharge Instructions      Based on your described symptoms and the duration of symptoms it is likely that you have a viral upper respiratory infection (often called a cold)  Symptoms can last for 3-10 days with lingering cough and intermittent symptoms potentially  lasting several  weeks after that.  The goal of treatment at this time is to reduce your symptoms and discomfort   You can use the following medications and measures to help  yourself feel better until your body fights this off: DayQuil/NyQuil, TheraFlu, Alka-Seltzer  (these medications typically have the same active ingredients in them so you can choose whichever one you prefer and take consistently during the day and night according to the manufactures instructions.) Flonase A daily antihistamine such as Zyrtec, Claritin, Allegra per your preference.  Please choose 1 and take consistently. Increased fluids.  It is recommended that you take in at least 64 ounces of water per day when you are not sick so it is important to increase this when you are sick and your body may be running fever. Rest Cough drops Chloraseptic throat spray to help with sore throat Nasal saline spray or nasal flushes to help with congestion and runny nose  I have sent in a script for Prednisone  taper to be taken in the morning with breakfast per the instructions on the container Remember that steroids can cause sleeplessness, irritability, increased hunger and elevated glucose levels so be mindful of these  side effects. They should lessen as you progress to the lower doses of the taper.   If your symptoms seem like they are getting worse over the next 5 to 7 days or not improving you can always follow-up here in urgent care or go to your primary care provider for further management. Go to the ER if you begin to have more serious symptoms such as shortness of breath, trouble breathing, loss of consciousness, swelling around the eyes, high fever, severe lasting headaches, vision changes or neck pain/stiffness.        ED Prescriptions     Medication Sig Dispense Auth. Provider   predniSONE  (DELTASONE ) 20 MG tablet Take 60mg  PO daily x 2 days, then40mg  PO daily x 2 days, then 20mg  PO daily x 3 days 13 tablet Eriyonna Matsushita E, PA-C      PDMP not reviewed this encounter.   Marylene Rocky BRAVO, PA-C 10/15/24 1216

## 2024-10-15 NOTE — ED Triage Notes (Signed)
 Pt presents with complaints of generalized body aches, chest pressure/tightness, SOB, and congestion that has now turned green. This is day five of symptoms. Has taken multiple at-home COVID/FLU tests and all were negative. OTC cold & cough medications taken + prescribed inhaler used with minimal improvement. 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 No pain.

## 2024-10-15 NOTE — Discharge Instructions (Addendum)
 Based on your described symptoms and the duration of symptoms it is likely that you have a viral upper respiratory infection (often called a "cold")  Symptoms can last for 3-10 days with lingering cough and intermittent symptoms potentially  lasting several  weeks after that.  The goal of treatment at this time is to reduce your symptoms and discomfort   You can use the following medications and measures to help yourself feel better until your body fights this off: DayQuil/NyQuil, TheraFlu, Alka-Seltzer  (these medications typically have the same active ingredients in them so you can choose whichever one you prefer and take consistently during the day and night according to the manufactures instructions.) Flonase A daily antihistamine such as Zyrtec, Claritin, Allegra per your preference.  Please choose 1 and take consistently. Increased fluids.  It is recommended that you take in at least 64 ounces of water per day when you are not sick so it is important to increase this when you are sick and your body may be running fever. Rest Cough drops Chloraseptic throat spray to help with sore throat Nasal saline spray or nasal flushes to help with congestion and runny nose  I have sent in a script for Prednisone taper to be taken in the morning with breakfast per the instructions on the container Remember that steroids can cause sleeplessness, irritability, increased hunger and elevated glucose levels so be mindful of these side effects. They should lessen as you progress to the lower doses of the taper.   If your symptoms seem like they are getting worse over the next 5 to 7 days or not improving you can always follow-up here in urgent care or go to your primary care provider for further management.  Go to the ER if you begin to have more serious symptoms such as shortness of breath, trouble breathing, loss of consciousness, swelling around the eyes, high fever, severe lasting headaches, vision  changes or neck pain/stiffness.

## 2024-10-30 ENCOUNTER — Other Ambulatory Visit: Payer: Self-pay | Admitting: Women's Health

## 2024-10-30 ENCOUNTER — Encounter: Payer: Self-pay | Admitting: Women's Health

## 2024-10-30 ENCOUNTER — Ambulatory Visit: Admitting: Psychiatry

## 2024-10-30 MED ORDER — METFORMIN HCL 500 MG PO TABS: 500.0000 mg | ORAL_TABLET | Freq: Two times a day (BID) | ORAL | 3 refills | Status: DC

## 2024-10-31 ENCOUNTER — Encounter: Admitting: Women's Health

## 2024-11-02 ENCOUNTER — Ambulatory Visit: Admitting: Pulmonary Disease

## 2024-11-13 ENCOUNTER — Ambulatory Visit: Admitting: Family Medicine

## 2024-11-15 ENCOUNTER — Other Ambulatory Visit: Payer: Self-pay | Admitting: Neurology

## 2024-11-15 ENCOUNTER — Encounter: Payer: Self-pay | Admitting: Neurology

## 2024-11-20 NOTE — Progress Notes (Deleted)
 "  NEUROLOGY FOLLOW UP OFFICE NOTE  Kari Reilly [id number]  Assessment/Plan:   Migraine without aura, without status migrainosus, not intractable Chronic tension-type headache, not intractable   Migraine prevention:  Plan to start Aimovig  140mg  every 28 days Migraine rescue:  She will try samples of ubrelvy 100mg ; Zofran  for nausea  Limit use of pain relievers to no more than 2 days out of week to prevent risk of rebound or medication-overuse headache. Keep headache diary She will get a dilated eye exam.   Follow up 6 months.      Subjective:  Kari Reilly is a 29 year old right-handed female with POTS, asthma and PCOS who follows up for migraines.  MRI of brain personally reviewed.  UPDATE: Last seen in March 2025.  Started Aimovig .  ***  Migraines:  Intensity:  4-5/10; Duration:  takes rizatriptan  and sleeps for several hours; Frequency:  2-3 times a week  Tension-type headache:  Intensity:  6/10; Duration:  all day; Frequency: 4 to 5 a month  .  Advised to get a repeat eye exam but she hasn't had an exam due to change in insurance.    Current NSAIDS/analgesics:  none Current triptans:  sumatriptan  100mg , rizatriptan  10mg  Current ergotamine:  none Current anti-emetic:  Zofran  4mg  Current muscle relaxants:  none Current Antihypertensive medications:  none Current Antidepressant medications:  nortriptyline  50mg  at bedtime Current Anticonvulsant medications:  none Current anti-CGRP:  Aimovig  140mg  ***, Holland 100mg  (samples ***) Current Vitamins/Herbal/Supplements:  CoQ10 600mg  daily, prenatal Current Antihistamines/Decongestants:  none Other therapy:  none Birth control:  none Other medications:  trazodone, Straterra   Caffeine:  1-2 times a week sweet tea or Dr. Nunzio Diet:  100 oz water daily.  Protein bar for breakfast and lunch.  High-protein dinner Exercise:  tries to walk 1-2 miles 4 times a week (limited due to POTS) Depression:  no; Anxiety:   yes Other pain:  back pain Sleep hygiene:  Good with trazodone  HISTORY: Migraines:  She has had migraines since middle school.  Initially would get 2 a year.  In late 2023-early 2024, they started becoming more frequent, now 4 times a month.  They are 7/10 throbbing headache across forehead and temples that can migrate to back of head.  Associated with nausea, vomiting, photophobia, and sometimes sees floaters.  Lasts 8-12 hours and occurs.  No known triggers.  Wearing a cold cap helps.  Over the counter NSAIDs and analgesics ineffective, so she doesn't take them.  Chronic tension-type headaches:  Around the same time, she started getting new headaches, less severe, 4/10.  They are in the same distribution as the migraines, sharp in the mid frontal region, otherwise a pressure.  Maybe slight associated photophobia but otherwise no associated symptoms.  They occur all day and are daily.  Does not treat with analgesics.    She cannot think of preceding trigger or cause for increased headaches.  No new stress.  In fact, stress level decreased when headaches started.  No new medication or change in environment.  She had COVID for the first time this year in 2024 but no preceding illness.    Since 2023, she started exhibiting dizziness.  Advised to lose weight and she lost 100 lbs but symptoms persisted.  She was diagnosed with POTS in 2024.   She reports that around 2020 she had an eye exam that revealed abnormality on the optic nerve.  She saw a specialist who told her it wasn't anything  concerning.    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 and vertical tortuosity of the optic nerves (findings that may be seen in idiopathic intracranial hypertension) as well as a tiny nonspecific chronic insult within the right parietal lobe  Past medications: Past NSAIDS/analgesics:  Excedrin, Tylenol , ibuprofen ,  naproxen  Past abortive triptans:  rizatriptan , sumatriptan  100mg  Past abortive ergotamine:  none Past muscle relaxants:  Flexeril  Past anti-emetic:  Phenergan  Past antihypertensive medications:  propranolol  10mg  Past antidepressant medications:  amitriptyline, nortriptyline , sertraline, Wellbutrin (worsened headaches) Past anticonvulsant medications:  none Past anti-CGRP:  none Past vitamins/Herbal/Supplements:  magnesium  oxide (rash)   Family history of headache:  mom (migraines), sisters (migraines)  PAST MEDICAL HISTORY: Past Medical History:  Diagnosis Date   Anxiety    Asthma    GERD (gastroesophageal reflux disease)    Headache    Migraine    PCOS (polycystic ovarian syndrome)    Pneumonia    POTS (postural orthostatic tachycardia syndrome)    Tachycardia    Vaginal Pap smear, abnormal     MEDICATIONS: Current Outpatient Medications on File Prior to Visit  Medication Sig Dispense Refill   letrozole  (FEMARA ) 2.5 MG tablet Take 1 tablet (2.5 mg total) by mouth daily. On days 3-7 of cycle 5 tablet 2   Albuterol  Sulfate (PROAIR  RESPICLICK) 108 (90 Base) MCG/ACT AEPB Inhale 2 puffs into the lungs every 6 (six) hours as needed. 1 each 0   Biotin 5000 MCG TABS Take 5,000 mcg by mouth daily.     cetirizine (ZYRTEC) 10 MG tablet Take 10 mg by mouth daily.     cholecalciferol (VITAMIN D3) 25 MCG (1000 UNIT) tablet Take 1,000 Units by mouth daily.     Coenzyme Q10 (COQ10) 100 MG CAPS Take 100 mg by mouth daily.     Fluticasone-Umeclidin-Vilant (TRELEGY ELLIPTA ) 200-62.5-25 MCG/ACT AEPB Inhale 1 puff into the lungs daily. (Patient not taking: Reported on 09/07/2024) 60 each 12   ipratropium-albuterol  (DUONEB) 0.5-2.5 (3) MG/3ML SOLN Inhale 3 mLs into the lungs every 2 (two) hours as needed (shortness of breath). (Patient not taking: Reported on 09/07/2024)     metFORMIN  (GLUCOPHAGE ) 500 MG tablet Take 1 tablet (500 mg total) by mouth 2 (two) times daily with a meal. 180 tablet 3    ondansetron  (ZOFRAN ) 4 MG tablet Take 1 tablet (4 mg total) by mouth 2 (two) times daily as needed. (Patient not taking: Reported on 09/07/2024) 20 tablet 5   pantoprazole  (PROTONIX ) 20 MG tablet Take 20 mg by mouth daily as needed for heartburn. (Patient not taking: Reported on 09/07/2024)     pantoprazole  (PROTONIX ) 40 MG tablet Take 40 mg by mouth daily.     predniSONE  (DELTASONE ) 20 MG tablet Take 60mg  PO daily x 2 days, then40mg  PO daily x 2 days, then 20mg  PO daily x 3 days 13 tablet 0   Prenatal Vit-Fe Fumarate-FA (PRENATAL MULTIVITAMIN) TABS tablet Take 1 tablet by mouth daily.     propranolol  (INDERAL ) 10 MG tablet Take 1 tablet (10 mg total) by mouth 2 (two) times daily. (Patient not taking: Reported on 09/07/2024) 60 tablet 6   ramelteon (ROZEREM) 8 MG tablet Take 8 mg by mouth at bedtime. (Patient not taking: Reported on 09/07/2024)     rizatriptan  (MAXALT -MLT) 10 MG disintegrating tablet Take by mouth. (Patient not taking: Reported on 09/07/2024)     topiramate  (TOPAMAX ) 25 MG tablet Take 3 tablets (75 mg total)  by mouth at bedtime. 90 tablet 5   No current facility-administered medications on file prior to visit.    ALLERGIES: Allergies  Allergen Reactions   Ciprofloxacin  Hives and Rash   Latex Rash   Magnesium  Oxide -Mg Supplement     Hives and itching on magnesium  oxide   Doxycycline  Nausea And Vomiting and Rash    FAMILY HISTORY: Family History  Problem Relation Age of Onset   Anemia Mother    Melanoma Mother    Breast cancer Maternal Grandmother    Colon cancer Paternal Grandfather    Prostate cancer Neg Hx    Pancreatic cancer Neg Hx    Ovarian cancer Neg Hx    Endometrial cancer Neg Hx       Objective:  *** General: No acute distress.  Patient appears well-groomed.   Head:  Normocephalic/atraumatic Neck:  Supple.  No paraspinal tenderness.  Full range of motion. Heart:  Regular rate and rhythm. Neuro:  Alert and oriented.  Speech fluent and not  dysarthric.  Language intact.  CN II-XII intact.  Bulk and tone normal.  Muscle strength 5/5 throughout.  Sensation to light touch intact.  Deep tendon reflexes 2+ throughout, toes downgoing.  Gait normal.  Romberg negative.   Juliene Dunnings, DO        "

## 2024-11-21 ENCOUNTER — Ambulatory Visit: Payer: Self-pay | Admitting: Neurology

## 2024-11-21 ENCOUNTER — Ambulatory Visit: Admitting: Family Medicine

## 2024-11-23 ENCOUNTER — Ambulatory Visit

## 2024-11-23 VITALS — BP 118/82 | HR 94 | Temp 98.0°F | Ht 65.0 in | Wt 194.1 lb

## 2024-11-23 DIAGNOSIS — F5104 Psychophysiologic insomnia: Secondary | ICD-10-CM

## 2024-11-23 DIAGNOSIS — Z3200 Encounter for pregnancy test, result unknown: Secondary | ICD-10-CM | POA: Diagnosis not present

## 2024-11-23 DIAGNOSIS — K219 Gastro-esophageal reflux disease without esophagitis: Secondary | ICD-10-CM | POA: Diagnosis not present

## 2024-11-23 DIAGNOSIS — G43009 Migraine without aura, not intractable, without status migrainosus: Secondary | ICD-10-CM | POA: Diagnosis not present

## 2024-11-23 DIAGNOSIS — Z23 Encounter for immunization: Secondary | ICD-10-CM | POA: Diagnosis not present

## 2024-11-23 DIAGNOSIS — Z0000 Encounter for general adult medical examination without abnormal findings: Secondary | ICD-10-CM

## 2024-11-23 DIAGNOSIS — Z1159 Encounter for screening for other viral diseases: Secondary | ICD-10-CM

## 2024-11-23 DIAGNOSIS — G90A Postural orthostatic tachycardia syndrome (POTS): Secondary | ICD-10-CM | POA: Insufficient documentation

## 2024-11-23 DIAGNOSIS — E282 Polycystic ovarian syndrome: Secondary | ICD-10-CM

## 2024-11-23 DIAGNOSIS — Z130 Encounter for screening for diseases of the blood and blood-forming organs and certain disorders involving the immune mechanism: Secondary | ICD-10-CM

## 2024-11-23 MED ORDER — TOPIRAMATE 25 MG PO TABS: 75.0000 mg | ORAL_TABLET | Freq: Every day | ORAL | 5 refills | Status: DC

## 2024-11-23 MED ORDER — TRAZODONE HCL 50 MG PO TABS: 50.0000 mg | ORAL_TABLET | Freq: Every day | ORAL | 2 refills | Status: DC

## 2024-11-23 MED ORDER — PANTOPRAZOLE SODIUM 40 MG PO TBEC: 40.0000 mg | DELAYED_RELEASE_TABLET | Freq: Every day | ORAL | 2 refills | Status: AC

## 2024-11-23 NOTE — Patient Instructions (Signed)
 VISIT SUMMARY: Today, you came in for a general check-up and to manage your medications. We discussed your ongoing health issues, including migraines, acid reflux, PCOS, and sleep disturbances. We also administered a pneumonia vaccine and scheduled lab work to monitor various health parameters.  YOUR PLAN: MIGRAINE: Your migraines are stable with your current medication. -Continue taking Topamax  75 mg at bedtime. Prescription refilled for 90 days.  GASTROESOPHAGEAL REFLUX DISEASE (GERD): You have acid reflux that is managed with pantoprazole . -Continue taking pantoprazole  40 mg once daily. Prescription refilled for 30 days. -Consider taking famotidine at night if you have breakthrough symptoms.  POLYCYSTIC OVARY SYNDROME (PCOS): You are managing PCOS with metformin  and letrozole  for fertility. -Continue taking metformin  500 mg twice daily.  SLEEP DISORDER: You have severe sleep issues that are currently managed with trazodone . -Continue taking trazodone  50 mg at night. Prescription refilled for 90 days.  GENERAL HEALTH MAINTENANCE: You are up to date on tetanus and flu vaccinations and received a pneumonia vaccine today. -Administered pneumonia vaccine today. -Scheduled fasting lab work to check kidney function, liver function, cholesterol, A1c, thyroid, and pregnancy status. -We will schedule a physical exam in six months.  If you have any problems before your next visit feel free to message me via MyChart (minor issues or questions) or call the office, otherwise you may reach out to schedule an office visit.  Thank you! Saddie Sacks, PA-C

## 2024-11-24 ENCOUNTER — Other Ambulatory Visit

## 2024-11-24 DIAGNOSIS — Z130 Encounter for screening for diseases of the blood and blood-forming organs and certain disorders involving the immune mechanism: Secondary | ICD-10-CM

## 2024-11-24 DIAGNOSIS — Z1159 Encounter for screening for other viral diseases: Secondary | ICD-10-CM

## 2024-11-25 LAB — CBC WITH DIFFERENTIAL/PLATELET
Basophils Absolute: 0 x10E3/uL (ref 0.0–0.2)
Basos: 0 %
EOS (ABSOLUTE): 0.1 x10E3/uL (ref 0.0–0.4)
Eos: 1 %
Hematocrit: 42.8 % (ref 34.0–46.6)
Hemoglobin: 13.9 g/dL (ref 11.1–15.9)
Immature Grans (Abs): 0 x10E3/uL (ref 0.0–0.1)
Immature Granulocytes: 0 %
Lymphocytes Absolute: 1.9 x10E3/uL (ref 0.7–3.1)
Lymphs: 24 %
MCH: 30.8 pg (ref 26.6–33.0)
MCHC: 32.5 g/dL (ref 31.5–35.7)
MCV: 95 fL (ref 79–97)
Monocytes Absolute: 0.4 x10E3/uL (ref 0.1–0.9)
Monocytes: 5 %
Neutrophils Absolute: 5.7 x10E3/uL (ref 1.4–7.0)
Neutrophils: 70 %
Platelets: 263 x10E3/uL (ref 150–450)
RBC: 4.52 x10E6/uL (ref 3.77–5.28)
RDW: 11.8 % (ref 11.7–15.4)
WBC: 8.2 x10E3/uL (ref 3.4–10.8)

## 2024-11-25 LAB — HEMOGLOBIN A1C
Est. average glucose Bld gHb Est-mCnc: 103 mg/dL
Hgb A1c MFr Bld: 5.2 % (ref 4.8–5.6)

## 2024-11-25 LAB — VITAMIN D 25 HYDROXY (VIT D DEFICIENCY, FRACTURES): Vit D, 25-Hydroxy: 37.4 ng/mL (ref 30.0–100.0)

## 2024-11-25 LAB — LIPID PANEL
Chol/HDL Ratio: 3.3 ratio (ref 0.0–4.4)
Cholesterol, Total: 147 mg/dL (ref 100–199)
HDL: 45 mg/dL
LDL Chol Calc (NIH): 91 mg/dL (ref 0–99)
Triglycerides: 52 mg/dL (ref 0–149)
VLDL Cholesterol Cal: 11 mg/dL (ref 5–40)

## 2024-11-25 LAB — TSH: TSH: 1.66 u[IU]/mL (ref 0.450–4.500)

## 2024-11-25 LAB — COMPREHENSIVE METABOLIC PANEL WITH GFR
ALT: 8 IU/L (ref 0–32)
AST: 11 IU/L (ref 0–40)
Albumin: 4.2 g/dL (ref 4.0–5.0)
Alkaline Phosphatase: 79 IU/L (ref 41–116)
BUN/Creatinine Ratio: 20 (ref 9–23)
BUN: 18 mg/dL (ref 6–20)
Bilirubin Total: 0.4 mg/dL (ref 0.0–1.2)
CO2: 19 mmol/L — ABNORMAL LOW (ref 20–29)
Calcium: 9.2 mg/dL (ref 8.7–10.2)
Chloride: 105 mmol/L (ref 96–106)
Creatinine, Ser: 0.88 mg/dL (ref 0.57–1.00)
Globulin, Total: 2.3 g/dL (ref 1.5–4.5)
Glucose: 76 mg/dL (ref 70–99)
Potassium: 4.6 mmol/L (ref 3.5–5.2)
Sodium: 136 mmol/L (ref 134–144)
Total Protein: 6.5 g/dL (ref 6.0–8.5)
eGFR: 91 mL/min/1.73

## 2024-11-25 LAB — HEPATITIS C ANTIBODY: Hep C Virus Ab: NONREACTIVE

## 2024-11-26 ENCOUNTER — Ambulatory Visit: Payer: Self-pay

## 2024-11-26 DIAGNOSIS — Z3A01 Less than 8 weeks gestation of pregnancy: Secondary | ICD-10-CM

## 2024-11-27 DIAGNOSIS — Z0000 Encounter for general adult medical examination without abnormal findings: Secondary | ICD-10-CM | POA: Insufficient documentation

## 2024-11-27 DIAGNOSIS — G43909 Migraine, unspecified, not intractable, without status migrainosus: Secondary | ICD-10-CM | POA: Insufficient documentation

## 2024-11-27 DIAGNOSIS — K219 Gastro-esophageal reflux disease without esophagitis: Secondary | ICD-10-CM | POA: Insufficient documentation

## 2024-11-27 DIAGNOSIS — G4700 Insomnia, unspecified: Secondary | ICD-10-CM | POA: Insufficient documentation

## 2024-11-27 NOTE — Assessment & Plan Note (Signed)
 Migraines controlled with Topamax  75 mg at bedtime. - Refilled Topamax  75 mg at bedtime for 90 days. - Cont Maxalt  prn for acute migraine

## 2024-11-27 NOTE — Assessment & Plan Note (Signed)
 Sleep issues managed with trazodone  50 mg at night. Trazodone  effective recently. - Refilled trazodone  50 mg at night for 90 days.

## 2024-11-27 NOTE — Progress Notes (Signed)
 "  New Patient Office Visit  Subjective    Patient ID: Kari Reilly, female    DOB: 25-Oct-1996  Age: 30 y.o. MRN: [id number]  CC:  Chief Complaint  Patient presents with   New Patient (Initial Visit)    Establish Care    History of Present Illness   Kari Reilly is a 29 year old female who presents for establishment of care and medication management.  Gynecologic abnormalities - History of abnormal Pap smears. - Underwent LEEP procedure; margins remain unclear. - Biopsy scheduled for next week. - Currently under care of OB   Polycystic ovary syndrome and infertility - Diagnosed with PCOS. - Takes metformin  500 mg twice daily. - On letrozole  for conception; currently third month of therapy after one year of unsuccessful attempts to conceive.  Migraine headaches - Migraines are stable on Topamax  75 mg at bedtime. - Has Maxalt  (rizatriptan ) for as-needed use; has not required them in the last six months. - Occasional use of Zofran  for nausea when migraines are not well-controlled; currently has sufficient supply.  Asthma - Managed by pulmonologist. - Uses inhalers as part of treatment regimen.  Gastrointestinal symptoms - Experiences acid reflux. - Takes pantoprazole , typically 40 mg in the morning and an additional 20 mg if symptoms persist during the day. - History of GI issues; underwent colonoscopy and EGD last year, - Lost 50 pounds while on GLP-1 medication; has not resumed due to illness post-surgery.  Sleep disturbances - Severe sleep issues. - Recently switched back to trazodone  50 mg at night after Belsomra became ineffective. - Has used temazepam in the past but prefers to avoid controlled substances.       Screenings:  Colon Cancer: Done Lung Cancer: NA Breast Cancer: NA Cervical Cancer: UTD; managed by OB Diabetes:  Checking A1c with labs HLD: Checking lipid panel with labs The ASCVD Risk score (Arnett DK, et al., 2019) failed to calculate for the  following reasons:   The 2019 ASCVD risk score is only valid for ages 60 to 96   * - Cholesterol units were assumed   Outpatient Encounter Medications as of 11/23/2024  Medication Sig   letrozole  (FEMARA ) 2.5 MG tablet Take 1 tablet (2.5 mg total) by mouth daily. On days 3-7 of cycle   traZODone  (DESYREL ) 50 MG tablet Take 1 tablet (50 mg total) by mouth at bedtime.   Albuterol  Sulfate (PROAIR  RESPICLICK) 108 (90 Base) MCG/ACT AEPB Inhale 2 puffs into the lungs every 6 (six) hours as needed.   metFORMIN  (GLUCOPHAGE ) 500 MG tablet Take 1 tablet (500 mg total) by mouth 2 (two) times daily with a meal.   pantoprazole  (PROTONIX ) 40 MG tablet Take 1 tablet (40 mg total) by mouth daily.   rizatriptan  (MAXALT -MLT) 10 MG disintegrating tablet Take by mouth. (Patient not taking: Reported on 09/07/2024)   topiramate  (TOPAMAX ) 25 MG tablet Take 3 tablets (75 mg total) by mouth at bedtime.   [DISCONTINUED] Biotin 5000 MCG TABS Take 5,000 mcg by mouth daily.   [DISCONTINUED] cetirizine (ZYRTEC) 10 MG tablet Take 10 mg by mouth daily.   [DISCONTINUED] cholecalciferol (VITAMIN D3) 25 MCG (1000 UNIT) tablet Take 1,000 Units by mouth daily.   [DISCONTINUED] Coenzyme Q10 (COQ10) 100 MG CAPS Take 100 mg by mouth daily.   [DISCONTINUED] Fluticasone-Umeclidin-Vilant (TRELEGY ELLIPTA ) 200-62.5-25 MCG/ACT AEPB Inhale 1 puff into the lungs daily. (Patient not taking: Reported on 09/07/2024)   [DISCONTINUED] ipratropium-albuterol  (DUONEB) 0.5-2.5 (3) MG/3ML SOLN Inhale 3 mLs into the lungs  every 2 (two) hours as needed (shortness of breath). (Patient not taking: Reported on 09/07/2024)   [DISCONTINUED] ondansetron  (ZOFRAN ) 4 MG tablet Take 1 tablet (4 mg total) by mouth 2 (two) times daily as needed. (Patient not taking: Reported on 09/07/2024)   [DISCONTINUED] pantoprazole  (PROTONIX ) 20 MG tablet Take 20 mg by mouth daily as needed for heartburn. (Patient not taking: Reported on 09/07/2024)   [DISCONTINUED] pantoprazole   (PROTONIX ) 40 MG tablet Take 40 mg by mouth daily.   [DISCONTINUED] predniSONE  (DELTASONE ) 20 MG tablet Take 60mg  PO daily x 2 days, then40mg  PO daily x 2 days, then 20mg  PO daily x 3 days   [DISCONTINUED] Prenatal Vit-Fe Fumarate-FA (PRENATAL MULTIVITAMIN) TABS tablet Take 1 tablet by mouth daily.   [DISCONTINUED] propranolol  (INDERAL ) 10 MG tablet Take 1 tablet (10 mg total) by mouth 2 (two) times daily. (Patient not taking: Reported on 09/07/2024)   [DISCONTINUED] ramelteon (ROZEREM) 8 MG tablet Take 8 mg by mouth at bedtime. (Patient not taking: Reported on 09/07/2024)   [DISCONTINUED] topiramate  (TOPAMAX ) 25 MG tablet Take 3 tablets (75 mg total) by mouth at bedtime.   No facility-administered encounter medications on file as of 11/23/2024.    Past Medical History:  Diagnosis Date   Anxiety    Asthma    GERD (gastroesophageal reflux disease)    Headache    Migraine    PCOS (polycystic ovarian syndrome)    Pneumonia    POTS (postural orthostatic tachycardia syndrome)    Tachycardia    Vaginal Pap smear, abnormal     Past Surgical History:  Procedure Laterality Date   ADENOIDECTOMY     CERVICAL CONIZATION W/BX N/A 04/18/2024   Procedure: CERVICAL CONE BIOPSY, ENDOCERVICAL CURETTAGE;  Surgeon: Eldonna Mays, MD;  Location: [facility];  Service: Gynecology;  Laterality: N/A;   IR ANGIOGRAM FOLLOW UP STUDY     LASIK Bilateral    LEEP  08/2022   TYMPANOSTOMY TUBE PLACEMENT      Family History  Problem Relation Age of Onset   Anemia Mother    Melanoma Mother    Breast cancer Maternal Grandmother    Colon cancer Paternal Grandfather    Prostate cancer Neg Hx    Pancreatic cancer Neg Hx    Ovarian cancer Neg Hx    Endometrial cancer Neg Hx     Social History   Socioeconomic History   Marital status: Divorced    Spouse name: Not on file   Number of children: 0   Years of education: 16   Highest education level: Bachelor's degree (e.g., BA, AB, BS)  Occupational History    Not on file  Tobacco Use   Smoking status: Never    Passive exposure: Current   Smokeless tobacco: Never  Vaping Use   Vaping status: Never Used  Substance and Sexual Activity   Alcohol use: Yes    Comment: rare   Drug use: No   Sexual activity: Yes    Birth control/protection: None  Other Topics Concern   Not on file  Social History Narrative   Right handed   Lives with alone   Currently working   One floor home   Drinks caffeine prn   Social Drivers of Health   Tobacco Use: Medium Risk (10/15/2024)   Patient History    Smoking Tobacco Use: Never    Smokeless Tobacco Use: Never    Passive Exposure: Current  Financial Resource Strain: Patient Declined (11/15/2024)   Overall Financial Resource Strain (CARDIA)  Difficulty of Paying Living Expenses: Patient declined  Food Insecurity: No Food Insecurity (11/15/2024)   Epic    Worried About Programme Researcher, Broadcasting/film/video in the Last Year: Never true    Ran Out of Food in the Last Year: Never true  Transportation Needs: No Transportation Needs (11/15/2024)   Epic    Lack of Transportation (Medical): No    Lack of Transportation (Non-Medical): No  Physical Activity: Insufficiently Active (11/15/2024)   Exercise Vital Sign    Days of Exercise per Week: 3 days    Minutes of Exercise per Session: 30 min  Stress: No Stress Concern Present (11/15/2024)   [employer] of Occupational Health - Occupational Stress Questionnaire    Feeling of Stress: Only a little  Social Connections: Moderately Isolated (11/15/2024)   Social Connection and Isolation Panel    Frequency of Communication with Friends and Family: More than three times a week    Frequency of Social Gatherings with Friends and Family: Once a week    Attends Religious Services: Never    Database Administrator or Organizations: Yes    Attends Banker Meetings: 1 to 4 times per year    Marital Status: Divorced  Intimate Partner Violence: Not At Risk  (02/07/2024)   Humiliation, Afraid, Rape, and Kick questionnaire    Fear of Current or Ex-Partner: No    Emotionally Abused: No    Physically Abused: No    Sexually Abused: No  Depression (PHQ2-9): High Risk (11/23/2024)   Depression (PHQ2-9)    PHQ-2 Score: 11  Alcohol Screen: Low Risk (02/07/2024)   Alcohol Screen    Last Alcohol Screening Score (AUDIT): 0  Housing: Unknown (11/15/2024)   Epic    Unable to Pay for Housing in the Last Year: Patient declined    Number of Times Moved in the Last Year: 0    Homeless in the Last Year: No  Utilities: Not At Risk (01/30/2024)   Received from [facility] Utilities    Threatened with loss of utilities: No  Health Literacy: Not on file    ROS  Per HPI      Objective    BP 118/82   Pulse 94   Temp 98 F (36.7 C) (Oral)   Ht 5' 5 (1.651 m)   Wt 194 lb 1.9 oz (88.1 kg)   LMP 10/30/2024   SpO2 98%   BMI 32.30 kg/m   Physical Exam Constitutional:      General: She is not in acute distress.    Appearance: Normal appearance.  Eyes:     Pupils: Pupils are equal, round, and reactive to light.  Cardiovascular:     Rate and Rhythm: Normal rate and regular rhythm.     Heart sounds: Normal heart sounds. No murmur heard.    No friction rub. No gallop.  Pulmonary:     Effort: Pulmonary effort is normal. No respiratory distress.     Breath sounds: Normal breath sounds.  Abdominal:     General: Bowel sounds are normal.  Musculoskeletal:        General: No swelling.     Cervical back: Neck supple.  Lymphadenopathy:     Cervical: No cervical adenopathy.  Skin:    General: Skin is warm and dry.  Neurological:     General: No focal deficit present.     Mental Status: She is alert.  Psychiatric:        Mood and Affect: Mood  normal.        Behavior: Behavior normal.        Thought Content: Thought content normal.          Assessment & Plan:   POTS (postural orthostatic tachycardia syndrome) Assessment &  Plan: Follows with cardiology. Symptoms are currently stable and well controlled without need for beta blocker.   Need for vaccination against Streptococcus pneumoniae -     Pneumococcal conjugate vaccine 20-valent  Encounter for pregnancy test, result unknown -     hCG, serum, qualitative  Screening for viral disease -     Hepatitis C antibody; Future  Screening for endocrine, nutritional, metabolic and immunity disorder -     VITAMIN D  25 Hydroxy (Vit-D Deficiency, Fractures); Future -     TSH; Future -     Hemoglobin A1c; Future -     Lipid panel; Future -     Comprehensive metabolic panel with GFR; Future -     CBC with Differential/Platelet; Future  PCOS (polycystic ovarian syndrome) Assessment & Plan: Followed and managed by OB. On Metofrmin 500 mg daily. Letrozole  to aid in conception.    Migraine without aura and without status migrainosus, not intractable Assessment & Plan: Migraines controlled with Topamax  75 mg at bedtime. - Refilled Topamax  75 mg at bedtime for 90 days. - Cont Maxalt  prn for acute migraine   Gastroesophageal reflux disease without esophagitis Assessment & Plan: GERD managed with pantoprazole  40 mg daily. Discussed famotidine for breakthrough symptoms. - Refilled pantoprazole  40 mg once daily for 30 days. - Consider famotidine at night for breakthrough symptoms.   Psychophysiological insomnia Assessment & Plan: Sleep issues managed with trazodone  50 mg at night. Trazodone  effective recently. - Refilled trazodone  50 mg at night for 90 days.    Healthcare maintenance Assessment & Plan: Up to date on tetanus and flu vaccinations. Eligible for pneumonia vaccine due to asthma history. Discussed potential side effects. ACOG recommends pneumococcal vaccine for pregnant individuals. - Administered pneumonia vaccine today. - Scheduled fasting lab work to check kidney function, liver function, cholesterol, A1c, thyroid, and pregnancy status. -  Will schedule physical exam in six months.    Other orders -     Topiramate ; Take 3 tablets (75 mg total) by mouth at bedtime.  Dispense: 90 tablet; Refill: 5 -     Pantoprazole  Sodium; Take 1 tablet (40 mg total) by mouth daily.  Dispense: 30 tablet; Refill: 2 -     traZODone  HCl; Take 1 tablet (50 mg total) by mouth at bedtime.  Dispense: 30 tablet; Refill: 2     Return in about 6 months (around 05/23/2025) for Physical.   Kari JULIANNA Sacks, PA-C  "

## 2024-11-27 NOTE — Assessment & Plan Note (Signed)
 Followed and managed by OB. On Metofrmin 500 mg daily. Letrozole  to aid in conception.

## 2024-11-27 NOTE — Assessment & Plan Note (Signed)
 Follows with cardiology. Symptoms are currently stable and well controlled without need for beta blocker.

## 2024-11-27 NOTE — Assessment & Plan Note (Signed)
 GERD managed with pantoprazole  40 mg daily. Discussed famotidine for breakthrough symptoms. - Refilled pantoprazole  40 mg once daily for 30 days. - Consider famotidine at night for breakthrough symptoms.

## 2024-11-27 NOTE — Assessment & Plan Note (Signed)
 Up to date on tetanus and flu vaccinations. Eligible for pneumonia vaccine due to asthma history. Discussed potential side effects. ACOG recommends pneumococcal vaccine for pregnant individuals. - Administered pneumonia vaccine today. - Scheduled fasting lab work to check kidney function, liver function, cholesterol, A1c, thyroid, and pregnancy status. - Will schedule physical exam in six months.

## 2024-11-28 ENCOUNTER — Other Ambulatory Visit

## 2024-11-28 ENCOUNTER — Encounter: Payer: Self-pay | Admitting: Women's Health

## 2024-11-28 DIAGNOSIS — Z3A01 Less than 8 weeks gestation of pregnancy: Secondary | ICD-10-CM

## 2024-11-28 LAB — BETA HCG QUANT (REF LAB): hCG Quant: 35 m[IU]/mL

## 2024-11-28 LAB — SPECIMEN STATUS REPORT

## 2024-11-29 ENCOUNTER — Ambulatory Visit: Payer: Self-pay

## 2024-11-29 ENCOUNTER — Other Ambulatory Visit: Payer: Self-pay

## 2024-11-29 ENCOUNTER — Ambulatory Visit: Admitting: Women's Health

## 2024-11-29 ENCOUNTER — Encounter: Payer: Self-pay | Admitting: Women's Health

## 2024-11-29 ENCOUNTER — Telehealth: Payer: Self-pay

## 2024-11-29 VITALS — BP 132/84 | HR 106 | Ht 65.0 in | Wt 194.0 lb

## 2024-11-29 DIAGNOSIS — Z3201 Encounter for pregnancy test, result positive: Secondary | ICD-10-CM | POA: Diagnosis not present

## 2024-11-29 DIAGNOSIS — D069 Carcinoma in situ of cervix, unspecified: Secondary | ICD-10-CM

## 2024-11-29 DIAGNOSIS — Z3A01 Less than 8 weeks gestation of pregnancy: Secondary | ICD-10-CM

## 2024-11-29 DIAGNOSIS — Z3490 Encounter for supervision of normal pregnancy, unspecified, unspecified trimester: Secondary | ICD-10-CM

## 2024-11-29 LAB — POCT URINE PREGNANCY: Preg Test, Ur: POSITIVE — AB

## 2024-11-29 LAB — BETA HCG QUANT (REF LAB): hCG Quant: 306 m[IU]/mL

## 2024-11-29 NOTE — Progress Notes (Signed)
 "  GYN VISIT Patient name: Kari Reilly MRN [id number]  Date of birth: 03/28/1996 Chief Complaint:   Colposcopy  History of Present Illness:   Kari Reilly is a 29 y.o. G1P0000 Caucasian female @ 4.0w by certain LMP of 12/17, being seen today for colpo, however she doesn't feel comfortable doing now that she is pregnant. 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 She was supposed to repeat pap/colpo in December, however needed to reschedule til now.  Had HCG w/ PCP 1/5 of 35, repeat 1/13 was 306, she has another scheduled for tomorrow. She is taking pnv. Maybe some mild nausea, nothing bad.  Was on metformin  for h/o PCOS, protonix , topamax  for migraines, trazadone and ramelteon for sleep, propranolol  prn POTS. Has had some episodes of tachycardia lately, feels it could be r/t her excitement/anxiety about pregnancy. Sees Dr. Ross/cardiology.  Patient's last menstrual period was 11/01/2024 (exact date).     11/23/2024    3:09 PM 02/07/2024    1:53 PM  Depression screen PHQ 2/9  Decreased Interest 0 1  Down, Depressed, Hopeless 0 1  PHQ - 2 Score 0 2  Altered sleeping 3 2  Tired, decreased energy 3 3  Change in appetite 1 1  Feeling bad or failure about yourself  0 1  Trouble concentrating 3 3  Moving slowly or fidgety/restless 1 1  Suicidal thoughts 0 0  PHQ-9 Score 11 13   Difficult doing work/chores Somewhat difficult      Data saved with a previous flowsheet row definition        11/23/2024    3:10 PM 02/07/2024    1:53 PM  GAD 7 : Generalized Anxiety Score  Nervous, Anxious, on Edge 0 2  Control/stop worrying 0 1  Worry too much - different things 0 2  Trouble relaxing 1 2  Restless 1 3  Easily annoyed or irritable 2 2  Afraid - awful might happen 0 0  Total GAD 7 Score 4 12  Anxiety Difficulty Somewhat difficult      Review of Systems:   Pertinent items are noted in HPI Denies fever/chills,  dizziness, headaches, visual disturbances, fatigue, shortness of breath, chest pain, abdominal pain, vomiting, abnormal vaginal discharge/itching/odor/irritation, problems with periods, bowel movements, urination, or intercourse unless otherwise stated above.  Pertinent History Reviewed:  Reviewed past medical,surgical, social, obstetrical and family history.  Reviewed problem list, medications and allergies. Physical Assessment:   Vitals:   11/29/24 0859  BP: 132/84  Pulse: (!) 106  Weight: 194 lb (88 kg)  Height: 5' 5 (1.651 m)  Body mass index is 32.28 kg/m.       Physical Examination:   General appearance: alert, well appearing, and in no distress  Mental status: alert, oriented to person, place, and time  Skin: warm & dry   Cardiovascular: normal heart rate noted  Respiratory: normal respiratory effort, no distress  Abdomen: soft, non-tender   Pelvic/colpo: declined  Extremities: no edema   Chaperone: N/A  Results for orders placed or performed in visit on 11/29/24 (from the past 24 hours)  POCT urine pregnancy   Collection Time: 11/29/24  9:12 AM  Result Value Ref Range   Preg Test, Ur Positive (A) Negative    Assessment & Plan:  1) [redacted]w[redacted]d by certain LMP> will get dating u/s scheduled in 3wks. Can keep HCG w/ PCP tomorrow, will let me know if not doubling.  Continue pnv. Gave OB packet. Reviewed meds, stop metformin , topamax  (for migraines), trazadone & ramelteon (taking only for sleep). Propranolol  if needed, pt to notify Dr. Okey she is pregnant.   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  Note routed to Dr. Hoy Masters notifying her.   Meds: No orders of the defined types were placed in this encounter.   Orders Placed This Encounter  Procedures   US  OB Comp Less 14 Wks   POCT urine pregnancy    Return in about 3  weeks (around 12/20/2024) for US : dating.  Suzen JONELLE Fetters CNM, [facility] 11/29/2024 10:50 AM  "

## 2024-11-29 NOTE — Telephone Encounter (Signed)
 Kari Reilly called stating she was seen recently by  Dr.Booker [facility] and she is pregnant (EDD 9/23) Dr.Booker wanted her to call Dr.Newton d/t she was scheduled for the follow up biopsy today. It was cancelled.  Dr.Booker will be reaching out to Dr.Newton for advice because of pt's history.

## 2024-11-29 NOTE — Patient Instructions (Addendum)
 Brigitt, thank you for choosing our office today! We appreciate the opportunity to meet your healthcare needs. You may receive a short survey by mail, e-mail, or through [employer]. If you are happy with your care we would appreciate if you could take just a few minutes to complete the survey questions. We read all of your comments and take your feedback very seriously. Thank you again for choosing our office.  Center for [employer] [facility] Team at [facility]  [facility] at [facility] ([address] [place], [place] [address]) Entrance C, located off of [address] Free 24/7 valet parking   For Headaches:  Stay well hydrated, drink enough water so that your urine is clear, sometimes if you are dehydrated you can get headaches Eat small frequent meals and snacks, sometimes if you are hungry you can get headaches Sometimes you get headaches during pregnancy from the pregnancy hormones You can try tylenol  (1-2 regular strength 325mg  or 1-2 extra strength 500mg ) as directed on the box. The least amount of medication that works is best.  Cool compresses (cool wet washcloth or ice pack) to area of head that is hurting You can also try drinking a caffeinated drink to see if this will help If not helping, try below:  For Prevention of Headaches/Migraines: CoQ10 100mg  three times daily Vitamin B2 400mg  daily Magnesium  Oxide 400-600mg  daily  Foods to alleviate migraines:  1) dark leafy greens 2) avocado 3) tuna 4) salmon  5) beans and legumes  Foods to avoid: 1) Excessive (or irregular timing) coffee 3) aged cheeses 4) chocolate 5) citrus fruits 6) aspartame and other artifical sweeteners 7) yeast 8) MSG (in processed foods) 9) processed and cured meats 10) nuts and certain seeds 11) chicken livers and other organ meats 12) dairy products like buttermilk, sour cream, and yogurt 13) dried fruits like dates, figs, and raisins 14) garlic 15) onions 16) potato chips 17)  pickled foods like olives and sauerkraut 18) some fresh fruits like ripe banana, papaya, red plums, raspberries, kiwi, pineapple 19) tomato-based products  Recommend to keep a migraine diary: rate daily the severity of your headache (1-10) and what foods you eat that day to help determine patterns.   If You Get a Bad Headache/Migraine: Benadryl  25mg   Magnesium  Oxide 1 large Gatorade 2 extra strength Tylenol  (1,000mg  total) 1 cup coffee or Coke      If this doesn't help please call us  @ [phone]   Nausea & Vomiting Have saltine crackers or pretzels by your bed and eat a few bites before you raise your head out of bed in the morning Eat small frequent meals throughout the day instead of large meals Drink plenty of fluids throughout the day to stay hydrated, just don't drink a lot of fluids with your meals.  This can make your stomach fill up faster making you feel sick Do not brush your teeth right after you eat Products with real ginger are good for nausea, like ginger ale and ginger hard candy Make sure it says made with real ginger! Sucking on sour candy like lemon heads is also good for nausea If your prenatal vitamins make you nauseated, take them at night so you will sleep through the nausea Sea Bands If you feel like you need medicine for the nausea & vomiting please let us  know If you are unable to keep any fluids or food down please let us  know   Constipation Drink plenty of fluid, preferably water, throughout  the day Eat foods high in fiber such as fruits, vegetables, and grains Exercise, such as walking, is a good way to keep your bowels regular Drink warm fluids, especially warm prune juice, or decaf coffee Eat a 1/2 cup of real oatmeal (not instant), 1/2 cup applesauce, and 1/2-1 cup warm prune juice every day If needed, you may take Colace (docusate sodium ) stool softener once or twice a day to help keep the stool soft.  If you still are having problems with  constipation, you may take Miralax once daily as needed to help keep your bowels regular.   Home Blood Pressure Monitoring for Patients   Your provider has recommended that you check your blood pressure (BP) at least once a week at home. If you do not have a blood pressure cuff at home, one will be provided for you. Contact your provider if you have not received your monitor within 1 week.   Helpful Tips for Accurate Home Blood Pressure Checks  Don't smoke, exercise, or drink caffeine 30 minutes before checking your BP Use the restroom before checking your BP (a full bladder can raise your pressure) Relax in a comfortable upright chair Feet on the ground Left arm resting comfortably on a flat surface at the level of your heart Legs uncrossed Back supported Sit quietly and don't talk Place the cuff on your bare arm Adjust snuggly, so that only two fingertips can fit between your skin and the top of the cuff Check 2 readings separated by at least one minute Keep a log of your BP readings For a visual, please reference this diagram: http://ccnc.care/bpdiagram  Provider Name: [facility]     Phone: [phone]  Zone 1: ALL CLEAR  Continue to monitor your symptoms:  BP reading is less than 140 (top number) or less than 90 (bottom number)  No right upper stomach pain No headaches or seeing spots No feeling nauseated or throwing up No swelling in face and hands  Zone 2: CAUTION Call your doctor's office for any of the following:  BP reading is greater than 140 (top number) or greater than 90 (bottom number)  Stomach pain under your ribs in the middle or right side Headaches or seeing spots Feeling nauseated or throwing up Swelling in face and hands  Zone 3: EMERGENCY  Seek immediate medical care if you have any of the following:  BP reading is greater than160 (top number) or greater than 110 (bottom number) Severe headaches not improving with Tylenol  Serious difficulty  catching your breath Any worsening symptoms from Zone 2    First Trimester of Pregnancy The first trimester of pregnancy is from week 1 until the end of week 12 (months 1 through 3). A week after a sperm fertilizes an egg, the egg will implant on the wall of the uterus. This embryo will begin to develop into a baby. Genes from you and your partner are forming the baby. The female genes determine whether the baby is a boy or a girl. At 6-8 weeks, the eyes and face are formed, and the heartbeat can be seen on ultrasound. At the end of 12 weeks, all the baby's organs are formed.  Now that you are pregnant, you will want to do everything you can to have a healthy baby. Two of the most important things are to get good prenatal care and to follow your health care provider's instructions. Prenatal care is all the medical care you receive before the baby's birth. This care  will help prevent, find, and treat any problems during the pregnancy and childbirth. BODY CHANGES Your body goes through many changes during pregnancy. The changes vary from woman to woman.  You may gain or lose a couple of pounds at first. You may feel sick to your stomach (nauseous) and throw up (vomit). If the vomiting is uncontrollable, call your health care provider. You may tire easily. You may develop headaches that can be relieved by medicines approved by your health care provider. You may urinate more often. Painful urination may mean you have a bladder infection. You may develop heartburn as a result of your pregnancy. You may develop constipation because certain hormones are causing the muscles that push waste through your intestines to slow down. You may develop hemorrhoids or swollen, bulging veins (varicose veins). Your breasts may begin to grow larger and become tender. Your nipples may stick out more, and the tissue that surrounds them (areola) may become darker. Your gums may bleed and may be sensitive to brushing and  flossing. Dark spots or blotches (chloasma, mask of pregnancy) may develop on your face. This will likely fade after the baby is born. Your menstrual periods will stop. You may have a loss of appetite. You may develop cravings for certain kinds of food. You may have changes in your emotions from day to day, such as being excited to be pregnant or being concerned that something may go wrong with the pregnancy and baby. You may have more vivid and strange dreams. You may have changes in your hair. These can include thickening of your hair, rapid growth, and changes in texture. Some women also have hair loss during or after pregnancy, or hair that feels dry or thin. Your hair will most likely return to normal after your baby is born. WHAT TO EXPECT AT YOUR PRENATAL VISITS During a routine prenatal visit: You will be weighed to make sure you and the baby are growing normally. Your blood pressure will be taken. Your abdomen will be measured to track your baby's growth. The fetal heartbeat will be listened to starting around week 10 or 12 of your pregnancy. Test results from any previous visits will be discussed. Your health care provider may ask you: How you are feeling. If you are feeling the baby move. If you have had any abnormal symptoms, such as leaking fluid, bleeding, severe headaches, or abdominal cramping. If you have any questions. Other tests that may be performed during your first trimester include: Blood tests to find your blood type and to check for the presence of any previous infections. They will also be used to check for low iron levels (anemia) and Rh antibodies. Later in the pregnancy, blood tests for diabetes will be done along with other tests if problems develop. Urine tests to check for infections, diabetes, or protein in the urine. An ultrasound to confirm the proper growth and development of the baby. An amniocentesis to check for possible genetic problems. Fetal screens  for spina bifida and Down syndrome. You may need other tests to make sure you and the baby are doing well. HOME CARE INSTRUCTIONS  Medicines Follow your health care provider's instructions regarding medicine use. Specific medicines may be either safe or unsafe to take during pregnancy. Take your prenatal vitamins as directed. If you develop constipation, try taking a stool softener if your health care provider approves. Diet Eat regular, well-balanced meals. Choose a variety of foods, such as meat or vegetable-based protein, fish, milk and low-fat  dairy products, vegetables, fruits, and whole grain breads and cereals. Your health care provider will help you determine the amount of weight gain that is right for you. Avoid raw meat and uncooked cheese. These carry germs that can cause birth defects in the baby. Eating four or five small meals rather than three large meals a day may help relieve nausea and vomiting. If you start to feel nauseous, eating a few soda crackers can be helpful. Drinking liquids between meals instead of during meals also seems to help nausea and vomiting. If you develop constipation, eat more high-fiber foods, such as fresh vegetables or fruit and whole grains. Drink enough fluids to keep your urine clear or pale yellow. Activity and Exercise Exercise only as directed by your health care provider. Exercising will help you: Control your weight. Stay in shape. Be prepared for labor and delivery. Experiencing pain or cramping in the lower abdomen or low back is a good sign that you should stop exercising. Check with your health care provider before continuing normal exercises. Try to avoid standing for long periods of time. Move your legs often if you must stand in one place for a long time. Avoid heavy lifting. Wear low-heeled shoes, and practice good posture. You may continue to have sex unless your health care provider directs you otherwise. Relief of Pain or  Discomfort Wear a good support bra for breast tenderness.   Take warm sitz baths to soothe any pain or discomfort caused by hemorrhoids. Use hemorrhoid cream if your health care provider approves.   Rest with your legs elevated if you have leg cramps or low back pain. If you develop varicose veins in your legs, wear support hose. Elevate your feet for 15 minutes, 3-4 times a day. Limit salt in your diet. Prenatal Care Schedule your prenatal visits by the twelfth week of pregnancy. They are usually scheduled monthly at first, then more often in the last 2 months before delivery. Write down your questions. Take them to your prenatal visits. Keep all your prenatal visits as directed by your health care provider. Safety Wear your seat belt at all times when driving. Make a list of emergency phone numbers, including numbers for family, friends, the hospital, and police and fire departments. General Tips Ask your health care provider for a referral to a local prenatal education class. Begin classes no later than at the beginning of month 6 of your pregnancy. Ask for help if you have counseling or nutritional needs during pregnancy. Your health care provider can offer advice or refer you to specialists for help with various needs. Do not use hot tubs, steam rooms, or saunas. Do not douche or use tampons or scented sanitary pads. Do not cross your legs for long periods of time. Avoid cat litter boxes and soil used by cats. These carry germs that can cause birth defects in the baby and possibly loss of the fetus by miscarriage or stillbirth. Avoid all smoking, herbs, alcohol, and medicines not prescribed by your health care provider. Chemicals in these affect the formation and growth of the baby. Schedule a dentist appointment. At home, brush your teeth with a soft toothbrush and be gentle when you floss. SEEK MEDICAL CARE IF:  You have dizziness. You have mild pelvic cramps, pelvic pressure, or  nagging pain in the abdominal area. You have persistent nausea, vomiting, or diarrhea. You have a bad smelling vaginal discharge. You have pain with urination. You notice increased swelling in your face, hands, legs,  or ankles. SEEK IMMEDIATE MEDICAL CARE IF:  You have a fever. You are leaking fluid from your vagina. You have spotting or bleeding from your vagina. You have severe abdominal cramping or pain. You have rapid weight gain or loss. You vomit blood or material that looks like coffee grounds. You are exposed to German measles and have never had them. You are exposed to fifth disease or chickenpox. You develop a severe headache. You have shortness of breath. You have any kind of trauma, such as from a fall or a car accident. Document Released: 10/27/2001 Document Revised: 03/19/2014 Document Reviewed: 09/12/2013 [facility] Patient Information 2015 [place], [place]. This information is not intended to replace advice given to you by your health care provider. Make sure you discuss any questions you have with your health care provider.

## 2024-11-30 ENCOUNTER — Other Ambulatory Visit

## 2024-11-30 DIAGNOSIS — Z3A01 Less than 8 weeks gestation of pregnancy: Secondary | ICD-10-CM

## 2024-12-01 ENCOUNTER — Ambulatory Visit: Payer: Self-pay

## 2024-12-01 LAB — BETA HCG QUANT (REF LAB): hCG Quant: 703 m[IU]/mL

## 2024-12-15 ENCOUNTER — Inpatient Hospital Stay (HOSPITAL_COMMUNITY)

## 2024-12-15 ENCOUNTER — Inpatient Hospital Stay (HOSPITAL_COMMUNITY)
Admission: AD | Admit: 2024-12-15 | Discharge: 2024-12-15 | Disposition: A | Discharge status: Home / Self Care | Attending: Obstetrics and Gynecology | Admitting: Obstetrics and Gynecology

## 2024-12-15 DIAGNOSIS — O26891 Other specified pregnancy related conditions, first trimester: Secondary | ICD-10-CM | POA: Insufficient documentation

## 2024-12-15 DIAGNOSIS — W010XXA Fall on same level from slipping, tripping and stumbling without subsequent striking against object, initial encounter: Secondary | ICD-10-CM | POA: Insufficient documentation

## 2024-12-15 DIAGNOSIS — O26851 Spotting complicating pregnancy, first trimester: Secondary | ICD-10-CM | POA: Insufficient documentation

## 2024-12-15 DIAGNOSIS — O30041 Twin pregnancy, dichorionic/diamniotic, first trimester: Secondary | ICD-10-CM | POA: Diagnosis not present

## 2024-12-15 DIAGNOSIS — R109 Unspecified abdominal pain: Secondary | ICD-10-CM | POA: Insufficient documentation

## 2024-12-15 DIAGNOSIS — Z3A01 Less than 8 weeks gestation of pregnancy: Secondary | ICD-10-CM | POA: Insufficient documentation

## 2024-12-15 DIAGNOSIS — W19XXXA Unspecified fall, initial encounter: Secondary | ICD-10-CM

## 2024-12-15 LAB — WET PREP, GENITAL
Clue Cells Wet Prep HPF POC: NONE SEEN
Sperm: NONE SEEN
Trich, Wet Prep: NONE SEEN
WBC, Wet Prep HPF POC: <10
Yeast Wet Prep HPF POC: NONE SEEN

## 2024-12-15 LAB — URINALYSIS, ROUTINE W REFLEX MICROSCOPIC
Bilirubin Urine: NEGATIVE
Glucose, UA: NEGATIVE mg/dL
Hgb urine dipstick: NEGATIVE
Ketones, ur: 5 mg/dL — AB
Leukocytes,Ua: NEGATIVE
Nitrite: NEGATIVE
Protein, ur: NEGATIVE mg/dL
Specific Gravity, Urine: 1.021 (ref 1.005–1.030)
pH: 5 (ref 5.0–8.0)

## 2024-12-15 LAB — CBC
HCT: 37.9 % (ref 36.0–46.0)
Hemoglobin: 13.1 g/dL (ref 12.0–15.0)
MCH: 31.1 pg (ref 26.0–34.0)
MCHC: 34.6 g/dL (ref 30.0–36.0)
MCV: 90 fL (ref 80.0–100.0)
Platelets: 251 10*3/uL (ref 150–400)
RBC: 4.21 MIL/uL (ref 3.87–5.11)
RDW: 11.6 % (ref 11.5–15.5)
WBC: 8.7 10*3/uL (ref 4.0–10.5)
nRBC: 0 % (ref 0.0–0.2)

## 2024-12-15 LAB — ABO/RH: ABO/RH(D): O POS

## 2024-12-15 LAB — HCG, QUANTITATIVE, PREGNANCY: hCG, Beta Chain, Quant, S: 69505 m[IU]/mL — ABNORMAL HIGH

## 2024-12-15 NOTE — MAU Provider Note (Cosign Needed)
 CC/ Vaginal bleeding and cramping s/p slip and fall    Subjective Kari Reilly is a 29 y.o. G1P0000 patient who presents to MAU today with complaint of patient reports that at 1350  she had a slip and fall on her bottom.  She reports she started spotting around 5 PM today and has had some mild abdominal cramping.  Patient reports she recently found out she was pregnant.  She reports she is approximately 6 weeks 2 days pregnant with a very desired pregnancy.     Objective BP 129/84   Pulse 96   Temp 98.9 F (37.2 C)   Resp 18   Ht 5' 5 (1.651 m)   Wt 88.5 kg   LMP 11/01/2024 (Exact Date)   BMI 32.45 kg/m  Physical Exam Vitals and nursing note reviewed.  Constitutional:      General: She is not in acute distress.    Appearance: Normal appearance. She is not ill-appearing.  HENT:     Head: Normocephalic.     Nose: Nose normal.     Mouth/Throat:     Mouth: Mucous membranes are moist.  Cardiovascular:     Rate and Rhythm: Normal rate.  Pulmonary:     Effort: Pulmonary effort is normal.  Abdominal:     General: There is no distension.     Palpations: Abdomen is soft.     Tenderness: There is no abdominal tenderness. There is no guarding.  Musculoskeletal:        General: Normal range of motion.     Cervical back: Normal range of motion.  Skin:    General: Skin is warm.  Neurological:     Mental Status: She is alert and oriented to person, place, and time.  Psychiatric:        Mood and Affect: Affect is tearful.        Behavior: Behavior normal.        Thought Content: Thought content normal.        Judgment: Judgment normal.     MDM  HIGH    Vaginal bleeding in early pregnacy CBC:NM for pregnancy HCG Quant: ABO: OB Ultrasound Vaginal Swabs: Wet prep negative/GC pending  UA: No evidence of UTI   Differential diagnosis considered for 1st trimester vaginal bleeding includes but is not limited to: ectopic pregnancy, complete spontaneous abortion,  incomplete abortion, missed abortion, threatened abortion, embryonic/fetal demise, cervical insufficiency, cervical or vaginal disorder    Orders Placed This Encounter  Procedures   Wet prep, genital    Standing Status:   Standing    Number of Occurrences:   1   US  OB LESS THAN 14 WEEKS WITH OB TRANSVAGINAL    Standing Status:   Standing    Number of Occurrences:   1    Symptom/Reason for Exam:   Vaginal bleeding [[id number]]   CBC    Standing Status:   Standing    Number of Occurrences:   1   hCG, quantitative, pregnancy    Standing Status:   Standing    Number of Occurrences:   1   RPR W/RFLX TO RPR TITER, TREPONEMAL AB, SCREEN AND DIAGNOSIS    Standing Status:   Standing    Number of Occurrences:   1   Urinalysis, Routine w reflex microscopic -Urine, Random    Standing Status:   Standing    Number of Occurrences:   1    Specimen Source:   Urine, Random [[id number]]   ABO/Rh  Standing Status:   Standing    Number of Occurrences:   1      Results for orders placed or performed during the hospital encounter of 12/15/24 (from the past 24 hours)  Wet prep, genital     Status: None   Collection Time: 12/15/24  6:40 PM   Specimen: PATH Cytology Cervicovaginal Ancillary Only  Result Value Ref Range   Yeast Wet Prep HPF POC NONE SEEN NONE SEEN   Trich, Wet Prep NONE SEEN NONE SEEN   Clue Cells Wet Prep HPF POC NONE SEEN NONE SEEN   WBC, Wet Prep HPF POC <10 <10   Sperm NONE SEEN   Urinalysis, Routine w reflex microscopic -Urine, Random     Status: Abnormal   Collection Time: 12/15/24  6:40 PM  Result Value Ref Range   Color, Urine YELLOW YELLOW   APPearance HAZY (A) CLEAR   Specific Gravity, Urine 1.021 1.005 - 1.030   pH 5.0 5.0 - 8.0   Glucose, UA NEGATIVE NEGATIVE mg/dL   Hgb urine dipstick NEGATIVE NEGATIVE   Bilirubin Urine NEGATIVE NEGATIVE   Ketones, ur 5 (A) NEGATIVE mg/dL   Protein, ur NEGATIVE NEGATIVE mg/dL   Nitrite NEGATIVE NEGATIVE   Leukocytes,Ua NEGATIVE NEGATIVE   ABO/Rh     Status: None (Preliminary result)   Collection Time: 12/15/24  7:00 PM  Result Value Ref Range   ABO/RH(D) PENDING   CBC     Status: None   Collection Time: 12/15/24  7:03 PM  Result Value Ref Range   WBC 8.7 4.0 - 10.5 K/uL   RBC 4.21 3.87 - 5.11 MIL/uL   Hemoglobin 13.1 12.0 - 15.0 g/dL   HCT 62.0 63.9 - 53.9 %   MCV 90.0 80.0 - 100.0 fL   MCH 31.1 26.0 - 34.0 pg   MCHC 34.6 30.0 - 36.0 g/dL   RDW 88.3 88.4 - 84.4 %   Platelets 251 150 - 400 K/uL   nRBC 0.0 0.0 - 0.2 %      ASSESSMENT/PLAN Medical screening exam complete  Patient care signed out to oncoming provider DOROTHA Duncans, CNM @2000  Labs/ Ultrasound pending . L. Littie, NP     Future Appointments  Date Time Provider Department Center  12/20/2024  3:45 PM [facility]  [facility] None  05/23/2025  3:10 PM Gayle Saddie FALCON, PA-C [facility]    Reassessment (9:31 PM) -She reports cramping has resolved.  -No spotting after ultrasound.  -Informed that final US  report still pending, but review of US  images shows live IUP c/w dates with FHR noted at 111. -Congratulations given and patient expresses gratitude. -Given US  pictures and noted additional gestation.  -Reviewed imaging again and apologies given as provider overlooked initial images. -Congratulations given again for twin gestation! -Informed that both fetuses with good heart rates (115/111) and dating c/w LMP. -Patient is ecstatic stating she has been trying to conceive for the past year.  -Precautions reviewed. -Patient instructed to follow up as scheduled.   Harlene LITTIE Duncans MSN, CNM Advanced Practice Provider, Center for [facility]   Addendum 1:08 AM US  OB Comp Less 14 Wks Result Date: 12/15/2024 CLINICAL DATA:  Vaginal bleeding for 3 hours, beta HCG 69,505 EXAM: TWIN OBSTETRICAL ULTRASOUND <14 WKS TECHNIQUE: Transabdominal ultrasound was performed for evaluation of the gestation as well as the maternal uterus and adnexal regions.  COMPARISON:  None Available. FINDINGS: Number of IUPs:  2 Chorionicity/Amnionicity:  Dichorionic-diamniotic (thick membrane) TWIN 1 Yolk sac:  Visualized. Embryo:  Visualized. Cardiac Activity: Visualized. Heart Rate: 115 bpm CRL:   4.0 mm   6 w 1 d                  US  EDC: 08/09/2025 TWIN 2 Yolk sac:  Visualized. Embryo:  Visualized. Cardiac Activity: Visualized. Heart Rate: 111 bpm CRL:   4.2 mm   6 w 1 d                  US  EDC: 08/09/2025 Subchorionic hemorrhage:  None visualized. Maternal uterus/adnexae: No adnexal masses or free fluid. IMPRESSION: 1. Live twin intrauterine pregnancy as above, with estimated age of 6 weeks and 1 day for both gestations. Electronically Signed   By: Ozell Daring M.D.   On: 12/15/2024 22:14   US  OB Comp AddL Gest Less 14 Wks Result Date: 12/15/2024 CLINICAL DATA:  Vaginal bleeding for 3 hours, beta HCG 69,505 EXAM: TWIN OBSTETRICAL ULTRASOUND <14 WKS TECHNIQUE: Transabdominal ultrasound was performed for evaluation of the gestation as well as the maternal uterus and adnexal regions. COMPARISON:  None Available. FINDINGS: Number of IUPs:  2 Chorionicity/Amnionicity:  Dichorionic-diamniotic (thick membrane) TWIN 1 Yolk sac:  Visualized. Embryo:  Visualized. Cardiac Activity: Visualized. Heart Rate: 115 bpm CRL:   4.0 mm   6 w 1 d                  US  EDC: 08/09/2025 TWIN 2 Yolk sac:  Visualized. Embryo:  Visualized. Cardiac Activity: Visualized. Heart Rate: 111 bpm CRL:   4.2 mm   6 w 1 d                  US  EDC: 08/09/2025 Subchorionic hemorrhage:  None visualized. Maternal uterus/adnexae: No adnexal masses or free fluid. IMPRESSION: 1. Live twin intrauterine pregnancy as above, with estimated age of 6 weeks and 1 day for both gestations. Electronically Signed   By: Ozell Daring M.D.   On: 12/15/2024 22:14

## 2024-12-15 NOTE — MAU Note (Signed)
 Kari Reilly is a 29 y.o. at [redacted]w[redacted]d here in MAU reporting: about 150pm slipped and fell on her bottom. Felt ok after no pain. Started having spotting around 5pm. Called ob and told to come in.  Reports her stomach started cramping after she saw the blood. Not sure if I was because she got upset that her stomach is hurting.  Reports back pain as well from the fall.  LMP:  Onset of complaint: 5pm  Pain score: 5 Vitals:   12/15/24 1820  BP: 129/84  Pulse: 96  Resp: 18  Temp: 98.9 F (37.2 C)     FHT: 0  Lab orders placed from triage:

## 2024-12-15 NOTE — [facility] Provider Note (Incomplete Revision)
 CC/ Vaginal bleeding and cramping s/p slip and fall    Subjective Kari Reilly is a 29 y.o. G1P0000 patient who presents to [facility]U today with complaint of patient reports that at 1350  she had a slip and fall on her bottom.  She reports she started spotting around 5 PM today and has had some mild abdominal cramping.  Patient reports she recently found out she was pregnant.  She reports she is approximately 6 weeks 2 days pregnant with a very desired pregnancy.     Objective BP 129/84   Pulse 96   Temp 98.9 F (37.2 C)   Resp 18   Ht 5' 5 (1.651 m)   Wt 88.5 kg   LMP 11/01/2024 (Exact Date)   BMI 32.45 kg/m  Physical Exam Vitals and nursing note reviewed.  Constitutional:      General: She is not in acute distress.    Appearance: Normal appearance. She is not ill-appearing.  HENT:     Head: Normocephalic.     Nose: Nose normal.     Mouth/Throat:     Mouth: Mucous membranes are moist.  Cardiovascular:     Rate and Rhythm: Normal rate.  Pulmonary:     Effort: Pulmonary effort is normal.  Abdominal:     General: There is no distension.     Palpations: Abdomen is soft.     Tenderness: There is no abdominal tenderness. There is no guarding.  Musculoskeletal:        General: Normal range of motion.     Cervical back: Normal range of motion.  Skin:    General: Skin is warm.  Neurological:     Mental Status: She is alert and oriented to person, place, and time.  Psychiatric:        Mood and Affect: Affect is tearful.        Behavior: Behavior normal.        Thought Content: Thought content normal.        Judgment: Judgment normal.     MDM  HIGH    Vaginal bleeding in early pregnacy CBC:NM for pregnancy HCG Quant: ABO: OB Ultrasound Vaginal Swabs: Wet prep negative/GC pending  UA: No evidence of UTI   Differential diagnosis considered for 1st trimester vaginal bleeding includes but is not limited to: ectopic pregnancy, complete spontaneous abortion,  incomplete abortion, missed abortion, threatened abortion, embryonic/fetal demise, cervical insufficiency, cervical or vaginal disorder    Orders Placed This Encounter  Procedures   Wet prep, genital    Standing Status:   Standing    Number of Occurrences:   1   US  OB LESS THAN 14 WEEKS WITH OB TRANSVAGINAL    Standing Status:   Standing    Number of Occurrences:   1    Symptom/Reason for Exam:   Vaginal bleeding [[id number]]   CBC    Standing Status:   Standing    Number of Occurrences:   1   hCG, quantitative, pregnancy    Standing Status:   Standing    Number of Occurrences:   1   RPR W/RFLX TO RPR TITER, TREPONEMAL AB, SCREEN AND DIAGNOSIS    Standing Status:   Standing    Number of Occurrences:   1   Urinalysis, Routine w reflex microscopic -Urine, Random    Standing Status:   Standing    Number of Occurrences:   1    Specimen Source:   Urine, Random [[id number]]   ABO/Rh  Standing Status:   Standing    Number of Occurrences:   1      Results for orders placed or performed during the hospital encounter of 12/15/24 (from the past 24 hours)  Wet prep, genital     Status: None   Collection Time: 12/15/24  6:40 PM   Specimen: PATH Cytology Cervicovaginal Ancillary Only  Result Value Ref Range   Yeast Wet Prep HPF POC NONE SEEN NONE SEEN   Trich, Wet Prep NONE SEEN NONE SEEN   Clue Cells Wet Prep HPF POC NONE SEEN NONE SEEN   WBC, Wet Prep HPF POC <10 <10   Sperm NONE SEEN   Urinalysis, Routine w reflex microscopic -Urine, Random     Status: Abnormal   Collection Time: 12/15/24  6:40 PM  Result Value Ref Range   Color, Urine YELLOW YELLOW   APPearance HAZY (A) CLEAR   Specific Gravity, Urine 1.021 1.005 - 1.030   pH 5.0 5.0 - 8.0   Glucose, UA NEGATIVE NEGATIVE mg/dL   Hgb urine dipstick NEGATIVE NEGATIVE   Bilirubin Urine NEGATIVE NEGATIVE   Ketones, ur 5 (A) NEGATIVE mg/dL   Protein, ur NEGATIVE NEGATIVE mg/dL   Nitrite NEGATIVE NEGATIVE   Leukocytes,Ua NEGATIVE NEGATIVE   ABO/Rh     Status: None (Preliminary result)   Collection Time: 12/15/24  7:00 PM  Result Value Ref Range   ABO/RH(D) PENDING   CBC     Status: None   Collection Time: 12/15/24  7:03 PM  Result Value Ref Range   WBC 8.7 4.0 - 10.5 K/uL   RBC 4.21 3.87 - 5.11 MIL/uL   Hemoglobin 13.1 12.0 - 15.0 g/dL   HCT 62.0 63.9 - 53.9 %   MCV 90.0 80.0 - 100.0 fL   MCH 31.1 26.0 - 34.0 pg   MCHC 34.6 30.0 - 36.0 g/dL   RDW 88.3 88.4 - 84.4 %   Platelets 251 150 - 400 K/uL   nRBC 0.0 0.0 - 0.2 %      ASSESSMENT/PLAN Medical screening exam complete  Patient care signed out to oncoming provider DOROTHA Duncans, CNM @2000  Labs/ Ultrasound pending . L. Littie, NP     Future Appointments  Date Time Provider Department Center  12/20/2024  3:45 PM [facility] US  [facility] None  05/23/2025  3:10 PM Gayle Saddie FALCON, PA-C [facility]    Reassessment (9:31 PM) -She reports cramping has resolved.  -No spotting after ultrasound.  -

## 2024-12-16 LAB — SYPHILIS: RPR W/REFLEX TO RPR TITER AND TREPONEMAL ANTIBODIES, TRADITIONAL SCREENING AND DIAGNOSIS ALGORITHM: RPR Ser Ql: NONREACTIVE

## 2024-12-18 LAB — GC/CHLAMYDIA PROBE AMP (~~LOC~~) NOT AT ARMC
Chlamydia: NEGATIVE
Comment: NEGATIVE
Comment: NORMAL
Neisseria Gonorrhea: NEGATIVE

## 2024-12-20 ENCOUNTER — Other Ambulatory Visit

## 2025-01-17 ENCOUNTER — Encounter: Admitting: Women's Health

## 2025-01-17 ENCOUNTER — Encounter: Admitting: *Deleted

## 2025-05-23 ENCOUNTER — Encounter
